# Patient Record
Sex: Female | Born: 1990 | Race: White | Hispanic: No | Marital: Married | Smoking: Former smoker
Health system: Southern US, Community
[De-identification: ages and names within clinical notes are randomized; demographics above are authoritative.]

## PROBLEM LIST (undated history)

## (undated) ENCOUNTER — Inpatient Hospital Stay (HOSPITAL_COMMUNITY): Payer: Self-pay

## (undated) DIAGNOSIS — O24419 Gestational diabetes mellitus in pregnancy, unspecified control: Secondary | ICD-10-CM

## (undated) DIAGNOSIS — R87629 Unspecified abnormal cytological findings in specimens from vagina: Secondary | ICD-10-CM

## (undated) DIAGNOSIS — R87619 Unspecified abnormal cytological findings in specimens from cervix uteri: Secondary | ICD-10-CM

## (undated) HISTORY — DX: Unspecified abnormal cytological findings in specimens from vagina: R87.629

## (undated) HISTORY — DX: Unspecified abnormal cytological findings in specimens from cervix uteri: R87.619

## (undated) HISTORY — PX: WISDOM TOOTH EXTRACTION: SHX21

---

## 2015-01-14 ENCOUNTER — Encounter: Payer: Self-pay | Admitting: Advanced Practice Midwife

## 2015-01-14 ENCOUNTER — Ambulatory Visit (INDEPENDENT_AMBULATORY_CARE_PROVIDER_SITE_OTHER): Payer: 59 | Admitting: Advanced Practice Midwife

## 2015-01-14 VITALS — BP 120/65 | HR 103 | Ht 62.0 in | Wt 155.0 lb

## 2015-01-14 DIAGNOSIS — Z124 Encounter for screening for malignant neoplasm of cervix: Secondary | ICD-10-CM

## 2015-01-14 DIAGNOSIS — Z113 Encounter for screening for infections with a predominantly sexual mode of transmission: Secondary | ICD-10-CM | POA: Diagnosis not present

## 2015-01-14 DIAGNOSIS — Z3481 Encounter for supervision of other normal pregnancy, first trimester: Secondary | ICD-10-CM | POA: Diagnosis not present

## 2015-01-14 DIAGNOSIS — Z3491 Encounter for supervision of normal pregnancy, unspecified, first trimester: Secondary | ICD-10-CM

## 2015-01-14 NOTE — Progress Notes (Signed)
   Subjective:    Savannah Wade is a G2P0010 7466w2d by US today being seen today for her first obstetrical visit.  Her obstetrical history is significant for Primiparity . Patient does intend to breast feed. Pregnancy history fully reviewed.  Patient reports no complaints.  Filed Vitals:   01/14/15 1037 01/14/15 1040  BP: 120/65   Pulse: 103   Height:  5\' 2"  (1.575 m)  Weight: 155 lb (70.308 kg)     HISTORY: OB History  Gravida Para Term Preterm AB SAB TAB Ectopic Multiple Living  2 0   1 1    0    # Outcome Date GA Lbr Len/2nd Weight Sex Delivery Anes PTL Lv  2 Current           1 SAB              Past Medical History  Diagnosis Date  . Vaginal Pap smear, abnormal    Past Surgical History  Procedure Laterality Date  . Wisdom tooth extraction     Family History  Problem Relation Age of Onset  . Cancer Father     testicular  . Diabetes Paternal Grandmother   . Heart attack Paternal Grandmother     bypass surgery  . Cancer - Colon Paternal Grandfather     colon  . Cancer Paternal Grandfather     prostate  . COPD Paternal Grandfather   . Heart attack Maternal Grandfather 50  . Hyperlipidemia Maternal Grandfather   . Hypertension Paternal Grandmother   . Heart attack Maternal Grandmother     x 2  . Coronary artery disease Maternal Grandmother      Exam    Uterus:   Slightly enlarged  Pelvic Exam:    Perineum: No Hemorrhoids, Normal Perineum   Vulva: normal   Vagina:  normal mucosa, normal discharge   pH: NA   Cervix: no bleeding following Pap, no cervical motion tenderness and nulliparous appearance   Adnexa: no mass, fullness, tenderness   Bony Pelvis: average and unproven  System: Breast:  Declined   Skin: normal coloration and turgor, no rashes    Neurologic: oriented, normal mood, grossly non-focal   Extremities: normal strength, tone, and muscle mass   HEENT sclera clear, anicteric   Mouth/Teeth mucous membranes moist, pharynx normal without  lesions and dental hygiene good   Neck supple and no masses   Cardiovascular: regular rate and rhythm, no murmurs or gallops   Respiratory:  appears well, vitals normal, no respiratory distress, acyanotic, normal RR, chest clear, no wheezing, crepitations, rhonchi, normal symmetric air entry   Abdomen: soft, non-tender; bowel sounds normal; no masses,  no organomegaly   Urinary: urethral meatus normal      Assessment:    Pregnancy: G2P0010 Patient Active Problem List   Diagnosis Date Noted  . Normal pregnancy in first trimester 01/14/2015     1. Normal pregnancy in first trimester  - Prenatal (OB Panel) - HIV antibody (with reflex) - CULTURE, URINE COMPREHENSIVE - Cystic fibrosis diagnostic study - Cytology - PAP     Plan:     Initial labs drawn. Prenatal vitamins. Problem list reviewed and updated. Genetic Screening discussed First Screen: declined.  Ultrasound discussed; fetal survey: requested.  Follow up in 4 weeks.  Dorathy KinsmanSMITH, Jaymon Dudek 01/14/2015

## 2015-01-14 NOTE — Progress Notes (Signed)
Bedside U/S shows IUP with FHT of 156 and CRL 12.458mm  GA is 7w 2 days.

## 2015-01-14 NOTE — Patient Instructions (Signed)
First Trimester of Pregnancy The first trimester of pregnancy is from week 1 until the end of week 12 (months 1 through 3). A week after a sperm fertilizes an egg, the egg will implant on the wall of the uterus. This embryo will begin to develop into a baby. Genes from you and your partner are forming the baby. The female genes determine whether the baby is a boy or a girl. At 6-8 weeks, the eyes and face are formed, and the heartbeat can be seen on ultrasound. At the end of 12 weeks, all the baby's organs are formed.  Now that you are pregnant, you will want to do everything you can to have a healthy baby. Two of the most important things are to get good prenatal care and to follow your health care provider's instructions. Prenatal care is all the medical care you receive before the baby's birth. This care will help prevent, find, and treat any problems during the pregnancy and childbirth. BODY CHANGES Your body goes through many changes during pregnancy. The changes vary from woman to woman.   You may gain or lose a couple of pounds at first.  You may feel sick to your stomach (nauseous) and throw up (vomit). If the vomiting is uncontrollable, call your health care provider.  You may tire easily.  You may develop headaches that can be relieved by medicines approved by your health care provider.  You may urinate more often. Painful urination may mean you have a bladder infection.  You may develop heartburn as a result of your pregnancy.  You may develop constipation because certain hormones are causing the muscles that push waste through your intestines to slow down.  You may develop hemorrhoids or swollen, bulging veins (varicose veins).  Your breasts may begin to grow larger and become tender. Your nipples may stick out more, and the tissue that surrounds them (areola) may become darker.  Your gums may bleed and may be sensitive to brushing and flossing.  Dark spots or blotches (chloasma,  mask of pregnancy) may develop on your face. This will likely fade after the baby is born.  Your menstrual periods will stop.  You may have a loss of appetite.  You may develop cravings for certain kinds of food.  You may have changes in your emotions from day to day, such as being excited to be pregnant or being concerned that something may go wrong with the pregnancy and baby.  You may have more vivid and strange dreams.  You may have changes in your hair. These can include thickening of your hair, rapid growth, and changes in texture. Some women also have hair loss during or after pregnancy, or hair that feels dry or thin. Your hair will most likely return to normal after your baby is born. WHAT TO EXPECT AT YOUR PRENATAL VISITS During a routine prenatal visit:  You will be weighed to make sure you and the baby are growing normally.  Your blood pressure will be taken.  Your abdomen will be measured to track your baby's growth.  The fetal heartbeat will be listened to starting around week 10 or 12 of your pregnancy.  Test results from any previous visits will be discussed. Your health care provider may ask you:  How you are feeling.  If you are feeling the baby move.  If you have had any abnormal symptoms, such as leaking fluid, bleeding, severe headaches, or abdominal cramping.  If you are using any tobacco products,   including cigarettes, chewing tobacco, and electronic cigarettes.  If you have any questions. Other tests that may be performed during your first trimester include:  Blood tests to find your blood type and to check for the presence of any previous infections. They will also be used to check for low iron levels (anemia) and Rh antibodies. Later in the pregnancy, blood tests for diabetes will be done along with other tests if problems develop.  Urine tests to check for infections, diabetes, or protein in the urine.  An ultrasound to confirm the proper growth  and development of the baby.  An amniocentesis to check for possible genetic problems.  Fetal screens for spina bifida and Down syndrome.  You may need other tests to make sure you and the baby are doing well.  HIV (human immunodeficiency virus) testing. Routine prenatal testing includes screening for HIV, unless you choose not to have this test. HOME CARE INSTRUCTIONS  Medicines  Follow your health care provider's instructions regarding medicine use. Specific medicines may be either safe or unsafe to take during pregnancy.  Take your prenatal vitamins as directed.  If you develop constipation, try taking a stool softener if your health care provider approves. Diet  Eat regular, well-balanced meals. Choose a variety of foods, such as meat or vegetable-based protein, fish, milk and low-fat dairy products, vegetables, fruits, and whole grain breads and cereals. Your health care provider will help you determine the amount of weight gain that is right for you.  Avoid raw meat and uncooked cheese. These carry germs that can cause birth defects in the baby.  Eating four or five small meals rather than three large meals a day may help relieve nausea and vomiting. If you start to feel nauseous, eating a few soda crackers can be helpful. Drinking liquids between meals instead of during meals also seems to help nausea and vomiting.  If you develop constipation, eat more high-fiber foods, such as fresh vegetables or fruit and whole grains. Drink enough fluids to keep your urine clear or pale yellow. Activity and Exercise  Exercise only as directed by your health care provider. Exercising will help you:  Control your weight.  Stay in shape.  Be prepared for labor and delivery.  Experiencing pain or cramping in the lower abdomen or low back is a good sign that you should stop exercising. Check with your health care provider before continuing normal exercises.  Try to avoid standing for long  periods of time. Move your legs often if you must stand in one place for a long time.  Avoid heavy lifting.  Wear low-heeled shoes, and practice good posture.  You may continue to have sex unless your health care provider directs you otherwise. Relief of Pain or Discomfort  Wear a good support bra for breast tenderness.   Take warm sitz baths to soothe any pain or discomfort caused by hemorrhoids. Use hemorrhoid cream if your health care provider approves.   Rest with your legs elevated if you have leg cramps or low back pain.  If you develop varicose veins in your legs, wear support hose. Elevate your feet for 15 minutes, 3-4 times a day. Limit salt in your diet. Prenatal Care  Schedule your prenatal visits by the twelfth week of pregnancy. They are usually scheduled monthly at first, then more often in the last 2 months before delivery.  Write down your questions. Take them to your prenatal visits.  Keep all your prenatal visits as directed by your   health care provider. Safety  Wear your seat belt at all times when driving.  Make a list of emergency phone numbers, including numbers for family, friends, the hospital, and police and fire departments. General Tips  Ask your health care provider for a referral to a local prenatal education class. Begin classes no later than at the beginning of month 6 of your pregnancy.  Ask for help if you have counseling or nutritional needs during pregnancy. Your health care provider can offer advice or refer you to specialists for help with various needs.  Do not use hot tubs, steam rooms, or saunas.  Do not douche or use tampons or scented sanitary pads.  Do not cross your legs for long periods of time.  Avoid cat litter boxes and soil used by cats. These carry germs that can cause birth defects in the baby and possibly loss of the fetus by miscarriage or stillbirth.  Avoid all smoking, herbs, alcohol, and medicines not prescribed by  your health care provider. Chemicals in these affect the formation and growth of the baby.  Do not use any tobacco products, including cigarettes, chewing tobacco, and electronic cigarettes. If you need help quitting, ask your health care provider. You may receive counseling support and other resources to help you quit.  Schedule a dentist appointment. At home, brush your teeth with a soft toothbrush and be gentle when you floss. SEEK MEDICAL CARE IF:   You have dizziness.  You have mild pelvic cramps, pelvic pressure, or nagging pain in the abdominal area.  You have persistent nausea, vomiting, or diarrhea.  You have a bad smelling vaginal discharge.  You have pain with urination.  You notice increased swelling in your face, hands, legs, or ankles. SEEK IMMEDIATE MEDICAL CARE IF:   You have a fever.  You are leaking fluid from your vagina.  You have spotting or bleeding from your vagina.  You have severe abdominal cramping or pain.  You have rapid weight gain or loss.  You vomit blood or material that looks like coffee grounds.  You are exposed to German measles and have never had them.  You are exposed to fifth disease or chickenpox.  You develop a severe headache.  You have shortness of breath.  You have any kind of trauma, such as from a fall or a car accident.   This information is not intended to replace advice given to you by your health care provider. Make sure you discuss any questions you have with your health care provider.   Document Released: 01/27/2001 Document Revised: 02/23/2014 Document Reviewed: 12/13/2012 Elsevier Interactive Patient Education 2016 [employer]  

## 2015-01-15 LAB — OBSTETRIC PANEL
ANTIBODY SCREEN: NEGATIVE
BASOS ABS: 0 10*3/uL (ref 0.0–0.1)
Basophils Relative: 0 % (ref 0–1)
EOS PCT: 2 % (ref 0–5)
Eosinophils Absolute: 0.2 10*3/uL (ref 0.0–0.7)
HCT: 40.4 % (ref 36.0–46.0)
HEMOGLOBIN: 13.8 g/dL (ref 12.0–15.0)
Hepatitis B Surface Ag: NEGATIVE
LYMPHS ABS: 1.6 10*3/uL (ref 0.7–4.0)
LYMPHS PCT: 21 % (ref 12–46)
MCH: 30.9 pg (ref 26.0–34.0)
MCHC: 34.2 g/dL (ref 30.0–36.0)
MCV: 90.6 fL (ref 78.0–100.0)
MPV: 10.2 fL (ref 8.6–12.4)
Monocytes Absolute: 0.5 10*3/uL (ref 0.1–1.0)
Monocytes Relative: 6 % (ref 3–12)
NEUTROS ABS: 5.4 10*3/uL (ref 1.7–7.7)
NEUTROS PCT: 71 % (ref 43–77)
PLATELETS: 242 10*3/uL (ref 150–400)
RBC: 4.46 MIL/uL (ref 3.87–5.11)
RDW: 13.9 % (ref 11.5–15.5)
RUBELLA: 1.02 {index} — AB (ref <0.90)
Rh Type: POSITIVE
WBC: 7.6 10*3/uL (ref 4.0–10.5)

## 2015-01-15 LAB — HIV ANTIBODY (ROUTINE TESTING W REFLEX): HIV: NONREACTIVE

## 2015-01-15 LAB — CYSTIC FIBROSIS DIAGNOSTIC STUDY

## 2015-01-16 LAB — CULTURE, URINE COMPREHENSIVE
Colony Count: NO GROWTH
Organism ID, Bacteria: NO GROWTH

## 2015-01-17 LAB — CYTOLOGY - PAP

## 2015-02-13 ENCOUNTER — Ambulatory Visit (INDEPENDENT_AMBULATORY_CARE_PROVIDER_SITE_OTHER): Payer: 59 | Admitting: Obstetrics & Gynecology

## 2015-02-13 ENCOUNTER — Encounter: Payer: Self-pay | Admitting: Obstetrics & Gynecology

## 2015-02-13 VITALS — BP 105/59 | HR 91 | Wt 157.0 lb

## 2015-02-13 DIAGNOSIS — Z3481 Encounter for supervision of other normal pregnancy, first trimester: Secondary | ICD-10-CM

## 2015-02-13 DIAGNOSIS — Z3491 Encounter for supervision of normal pregnancy, unspecified, first trimester: Secondary | ICD-10-CM

## 2015-02-13 DIAGNOSIS — F988 Other specified behavioral and emotional disorders with onset usually occurring in childhood and adolescence: Secondary | ICD-10-CM | POA: Insufficient documentation

## 2015-02-13 DIAGNOSIS — L409 Psoriasis, unspecified: Secondary | ICD-10-CM | POA: Insufficient documentation

## 2015-02-13 NOTE — Progress Notes (Signed)
Subjective:  Lanny HurstMegan Dearmas is a 24 y.o. G2P0010 at 5422w4d being seen today for ongoing prenatal care.  She is currently monitored for the following issues for this low-risk pregnancy and has Normal pregnancy in first trimester; Psoriasis; and ADD (attention deficit disorder) on her problem list.  Patient reports no complaints.   . Vag. Bleeding: None.   . Denies leaking of fluid.   The following portions of the patient's history were reviewed and updated as appropriate: allergies, current medications, past family history, past medical history, past social history, past surgical history and problem list. Problem list updated.  Objective:   Filed Vitals:   02/13/15 1027  BP: 105/59  Pulse: 91  Weight: 157 lb (71.215 kg)    Fetal Status: Fetal Heart Rate (bpm): 164         General:  Alert, oriented and cooperative. Patient is in no acute distress.  Skin: Skin is warm and dry. No rash noted.   Cardiovascular: Normal heart rate noted  Respiratory: Normal respiratory effort, no problems with respiration noted  Abdomen: Soft, gravid, appropriate for gestational age.       Pelvic: Vag. Bleeding: None Vag D/C Character: Thin   Cervical exam deferred        Extremities: Normal range of motion.  Edema: None  Mental Status: Normal mood and affect. Normal behavior. Normal judgment and thought content.   Urinalysis: Urine Protein: Negative Urine Glucose: Negative  Assessment and Plan:  Pregnancy: G2P0010 at 6722w4d  1. Normal pregnancy in first trimester -genetics declined -declined baby scripts -anatomy US at 19.5 weeks  Preterm labor symptoms and general obstetric precautions including but not limited to vaginal bleeding, contractions, leaking of fluid and fetal movement were reviewed in detail with the patient. Please refer to After Visit Summary for other counseling recommendations.  No Follow-up on file.   Lesly DukesKelly H Alexes Menchaca, MD

## 2015-02-25 ENCOUNTER — Telehealth: Payer: Self-pay | Admitting: *Deleted

## 2015-02-25 NOTE — Telephone Encounter (Signed)
Increase in headaches since pregnancy.  She uses tylenol and ice packs and it seems to go away.  She just wanting us to be aware of the headaches.  i explained that we do have a headache specialist that comes in once a month if we need to schedule her an appt.

## 2015-03-13 ENCOUNTER — Ambulatory Visit (INDEPENDENT_AMBULATORY_CARE_PROVIDER_SITE_OTHER): Payer: BLUE CROSS/BLUE SHIELD | Admitting: Obstetrics & Gynecology

## 2015-03-13 ENCOUNTER — Encounter: Payer: Self-pay | Admitting: Obstetrics & Gynecology

## 2015-03-13 VITALS — BP 103/63 | HR 84 | Wt 158.0 lb

## 2015-03-13 DIAGNOSIS — Z3482 Encounter for supervision of other normal pregnancy, second trimester: Secondary | ICD-10-CM

## 2015-03-13 DIAGNOSIS — Z3491 Encounter for supervision of normal pregnancy, unspecified, first trimester: Secondary | ICD-10-CM

## 2015-03-13 NOTE — Progress Notes (Signed)
Subjective:  Savannah Wade is a 25 y.o. MW G2P0010 at [redacted]w[redacted]d being seen today for ongoing prenatal care.  She is currently monitored for the following issues for this low-risk pregnancy and has Normal pregnancy in first trimester; Psoriasis; and ADD (attention deficit disorder) on her problem list.  Patient reports no complaints.  Contractions: Not present. Vag. Bleeding: None.  Movement: Absent. Denies leaking of fluid.   The following portions of the patient's history were reviewed and updated as appropriate: allergies, current medications, past family history, past medical history, past social history, past surgical history and problem list. Problem list updated.  Objective:   Filed Vitals:   03/13/15 0913  BP: 103/63  Pulse: 84  Weight: 158 lb (71.668 kg)    Fetal Status: Fetal Heart Rate (bpm): 149   Movement: Absent     General:  Alert, oriented and cooperative. Patient is in no acute distress.  Skin: Skin is warm and dry. No rash noted.   Cardiovascular: Normal heart rate noted  Respiratory: Normal respiratory effort, no problems with respiration noted  Abdomen: Soft, gravid, appropriate for gestational age. Pain/Pressure: Absent     Pelvic: Vag. Bleeding: None Vag D/C Character: Thin   Cervical exam deferred        Extremities: Normal range of motion.  Edema: None  Mental Status: Normal mood and affect. Normal behavior. Normal judgment and thought content.   Urinalysis: Urine Protein: Negative Urine Glucose: Negative  Assessment and Plan:  Pregnancy: G2P0010 at [redacted]w[redacted]d  1. Normal pregnancy in first trimester  - Flu Vaccine QUAD 36+ mos IM (Fluarix, Quad PF) - [place] MFM OB COMP + 14 WK; Future  Preterm labor symptoms and general obstetric precautions including but not limited to vaginal bleeding, contractions, leaking of fluid and fetal movement were reviewed in detail with the patient. Please refer to After Visit Summary for other counseling recommendations.  Return for u/s  should be at [redacted] weeks EGA. Thanks.   Allie Bossier, MD

## 2015-04-03 ENCOUNTER — Ambulatory Visit (HOSPITAL_COMMUNITY)
Admission: RE | Admit: 2015-04-03 | Discharge: 2015-04-03 | Disposition: A | Discharge status: Home / Self Care | Payer: BLUE CROSS/BLUE SHIELD | Source: Ambulatory Visit | Attending: Obstetrics & Gynecology | Admitting: Obstetrics & Gynecology

## 2015-04-03 DIAGNOSIS — Z3A18 18 weeks gestation of pregnancy: Secondary | ICD-10-CM | POA: Diagnosis not present

## 2015-04-03 DIAGNOSIS — Z3491 Encounter for supervision of normal pregnancy, unspecified, first trimester: Secondary | ICD-10-CM

## 2015-04-03 DIAGNOSIS — Z36 Encounter for antenatal screening of mother: Secondary | ICD-10-CM | POA: Diagnosis present

## 2015-04-10 ENCOUNTER — Ambulatory Visit (INDEPENDENT_AMBULATORY_CARE_PROVIDER_SITE_OTHER): Payer: BLUE CROSS/BLUE SHIELD | Admitting: Obstetrics & Gynecology

## 2015-04-10 VITALS — BP 100/62 | HR 94 | Wt 160.0 lb

## 2015-04-10 DIAGNOSIS — Z3491 Encounter for supervision of normal pregnancy, unspecified, first trimester: Secondary | ICD-10-CM

## 2015-04-10 DIAGNOSIS — Z3482 Encounter for supervision of other normal pregnancy, second trimester: Secondary | ICD-10-CM

## 2015-04-10 NOTE — Progress Notes (Signed)
Subjective:  Savannah Wade is a 25 y.o. G2P0010 at [redacted]w[redacted]d being seen today for ongoing prenatal care.  She is currently monitored for the following issues for this low-risk pregnancy and has Normal pregnancy in first trimester; Psoriasis; and ADD (attention deficit disorder) on her problem list.  Patient reports no complaints.  Contractions: Not present. Vag. Bleeding: None.  Movement: Present. Denies leaking of fluid.   The following portions of the patient's history were reviewed and updated as appropriate: allergies, current medications, past family history, past medical history, past social history, past surgical history and problem list. Problem list updated.  Objective:   Filed Vitals:   04/10/15 0925  BP: 100/62  Pulse: 94  Weight: 160 lb (72.576 kg)    Fetal Status: Fetal Heart Rate (bpm): 152   Movement: Present     General:  Alert, oriented and cooperative. Patient is in no acute distress.  Skin: Skin is warm and dry. No rash noted.   Cardiovascular: Normal heart rate noted  Respiratory: Normal respiratory effort, no problems with respiration noted  Abdomen: Soft, gravid, appropriate for gestational age. Pain/Pressure: Absent     Pelvic: Vag. Bleeding: None Vag D/C Character: Thin   Cervical exam deferred        Extremities: Normal range of motion.  Edema: None  Mental Status: Normal mood and affect. Normal behavior. Normal judgment and thought content.   Urinalysis: Urine Protein: Negative Urine Glucose: Negative  Assessment and Plan:  Pregnancy: G2P0010 at [redacted]w[redacted]d  1. Normal pregnancy in first trimester  - [place] MFM OB FOLLOW UP; Future  Preterm labor symptoms and general obstetric precautions including but not limited to vaginal bleeding, contractions, leaking of fluid and fetal movement were reviewed in detail with the patient. Please refer to After Visit Summary for other counseling recommendations.  Return in about 4 weeks (around 05/08/2015).   Allie Bossier, MD

## 2015-05-08 ENCOUNTER — Ambulatory Visit (INDEPENDENT_AMBULATORY_CARE_PROVIDER_SITE_OTHER): Payer: BLUE CROSS/BLUE SHIELD | Admitting: Obstetrics & Gynecology

## 2015-05-08 ENCOUNTER — Encounter: Payer: Self-pay | Admitting: Obstetrics & Gynecology

## 2015-05-08 VITALS — BP 100/65 | HR 82 | Wt 163.0 lb

## 2015-05-08 DIAGNOSIS — Z3491 Encounter for supervision of normal pregnancy, unspecified, first trimester: Secondary | ICD-10-CM

## 2015-05-08 DIAGNOSIS — Z3482 Encounter for supervision of other normal pregnancy, second trimester: Secondary | ICD-10-CM

## 2015-05-08 NOTE — Progress Notes (Signed)
Subjective:  Savannah HurstMegan Wade is a 25 y.o. G2P0010 at 2134w4d being seen today for ongoing prenatal care.  She is currently monitored for the following issues for this low-risk pregnancy and has Normal pregnancy in first trimester; Psoriasis; and ADD (attention deficit disorder) on her problem list.  Patient reports no complaints.  Contractions: Not present. Vag. Bleeding: None.  Movement: Present. Denies leaking of fluid.   The following portions of the patient's history were reviewed and updated as appropriate: allergies, current medications, past family history, past medical history, past social history, past surgical history and problem list. Problem list updated.  Objective:   Filed Vitals:   05/08/15 0902  BP: 100/65  Pulse: 82  Weight: 163 lb (73.936 kg)    Fetal Status:     Movement: Present     General:  Alert, oriented and cooperative. Patient is in no acute distress.  Skin: Skin is warm and dry. No rash noted.   Cardiovascular: Normal heart rate noted  Respiratory: Normal respiratory effort, no problems with respiration noted  Abdomen: Soft, gravid, appropriate for gestational age. Pain/Pressure: Absent     Pelvic: Vag. Bleeding: None Vag D/C Character: Thin   Cervical exam deferred        Extremities: Normal range of motion.  Edema: None  Mental Status: Normal mood and affect. Normal behavior. Normal judgment and thought content.   Urinalysis: Urine Protein: Negative Urine Glucose: Negative  Assessment and Plan:  Pregnancy: G2P0010 at 5334w4d  There are no diagnoses linked to this encounter. Preterm labor symptoms and general obstetric precautions including but not limited to vaginal bleeding, contractions, leaking of fluid and fetal movement were reviewed in detail with the patient. Please refer to After Visit Summary for other counseling recommendations.   Saline spary and humidifier for nasal irritation US In April for F/U anatomy Pt to pick pediatrician. Weight gain  reviewed.    Lesly DukesKelly H Kadeidra Coryell, MD

## 2015-05-22 ENCOUNTER — Ambulatory Visit (HOSPITAL_COMMUNITY)
Admission: RE | Admit: 2015-05-22 | Discharge: 2015-05-22 | Disposition: A | Discharge status: Home / Self Care | Payer: BLUE CROSS/BLUE SHIELD | Source: Ambulatory Visit | Attending: Obstetrics & Gynecology | Admitting: Obstetrics & Gynecology

## 2015-05-22 DIAGNOSIS — Z36 Encounter for antenatal screening of mother: Secondary | ICD-10-CM | POA: Insufficient documentation

## 2015-05-22 DIAGNOSIS — Z3491 Encounter for supervision of normal pregnancy, unspecified, first trimester: Secondary | ICD-10-CM

## 2015-05-22 DIAGNOSIS — Z3A25 25 weeks gestation of pregnancy: Secondary | ICD-10-CM | POA: Insufficient documentation

## 2015-06-05 ENCOUNTER — Ambulatory Visit (INDEPENDENT_AMBULATORY_CARE_PROVIDER_SITE_OTHER): Payer: BLUE CROSS/BLUE SHIELD | Admitting: Obstetrics & Gynecology

## 2015-06-05 VITALS — BP 105/59 | HR 88 | Wt 164.0 lb

## 2015-06-05 DIAGNOSIS — Z23 Encounter for immunization: Secondary | ICD-10-CM

## 2015-06-05 DIAGNOSIS — F988 Other specified behavioral and emotional disorders with onset usually occurring in childhood and adolescence: Secondary | ICD-10-CM

## 2015-06-05 DIAGNOSIS — Z3482 Encounter for supervision of other normal pregnancy, second trimester: Secondary | ICD-10-CM

## 2015-06-05 DIAGNOSIS — Z3491 Encounter for supervision of normal pregnancy, unspecified, first trimester: Secondary | ICD-10-CM

## 2015-06-05 DIAGNOSIS — F909 Attention-deficit hyperactivity disorder, unspecified type: Secondary | ICD-10-CM

## 2015-06-05 DIAGNOSIS — Z3492 Encounter for supervision of normal pregnancy, unspecified, second trimester: Secondary | ICD-10-CM

## 2015-06-05 DIAGNOSIS — Z36 Encounter for antenatal screening of mother: Secondary | ICD-10-CM | POA: Diagnosis not present

## 2015-06-05 LAB — CBC
HEMATOCRIT: 34.8 % — AB (ref 35.0–45.0)
HEMOGLOBIN: 11.5 g/dL — AB (ref 11.7–15.5)
MCH: 29.9 pg (ref 27.0–33.0)
MCHC: 33 g/dL (ref 32.0–36.0)
MCV: 90.6 fL (ref 80.0–100.0)
MPV: 10.3 fL (ref 7.5–12.5)
Platelets: 235 10*3/uL (ref 140–400)
RBC: 3.84 MIL/uL (ref 3.80–5.10)
RDW: 13.7 % (ref 11.0–15.0)
WBC: 9.4 10*3/uL (ref 3.8–10.8)

## 2015-06-05 NOTE — Progress Notes (Signed)
Subjective:  Savannah Wade is a 25 y.o. G2P0010 at 120w4d being seen today for ongoing prenatal care.  She is currently monitored for the following issues for this low-risk pregnancy and has Normal pregnancy in first trimester; Psoriasis; and ADD (attention deficit disorder) on her problem list.  Patient reports no complaints except for some SOB on occasion when she it sitting, never when she is working or walking. Several deep breaths will relieve this SOB.  Contractions: Not present. Vag. Bleeding: None.  Movement: Present. Denies leaking of fluid.   The following portions of the patient's history were reviewed and updated as appropriate: allergies, current medications, past family history, past medical history, past social history, past surgical history and problem list. Problem list updated.  Objective:   Filed Vitals:   06/05/15 0903  BP: 105/59  Pulse: 88  Weight: 164 lb (74.39 kg)    Fetal Status:     Movement: Present     General:  Alert, oriented and cooperative. Patient is in no acute distress.  Skin: Skin is warm and dry. No rash noted.   Cardiovascular: Normal heart rate noted  Respiratory: Normal respiratory effort, no problems with respiration noted  Abdomen: Soft, gravid, appropriate for gestational age. Pain/Pressure: Absent     Pelvic: Vag. Bleeding: None Vag D/C Character: Thin   Cervical exam deferred        Extremities: Normal range of motion.  Edema: None  Mental Status: Normal mood and affect. Normal behavior. Normal judgment and thought content.  Lungs: CTAB Urinalysis:      Assessment and Plan:  Pregnancy: G2P0010 at 7720w4d  1. Prenatal care in second trimester  - CBC - HIV antibody (with reflex) - RPR - Glucose Tolerance, 1 HR (50g)  2. Normal pregnancy in first trimester   3. ADD (attention deficit disorder)   Preterm labor symptoms and general obstetric precautions including but not limited to vaginal bleeding, contractions, leaking of fluid and  fetal movement were reviewed in detail with the patient. Please refer to After Visit Summary for other counseling recommendations.  Return in about 3 weeks (around 06/26/2015).   Allie BossierMyra C Sheralee Qazi, MD

## 2015-06-06 ENCOUNTER — Telehealth: Payer: Self-pay | Admitting: *Deleted

## 2015-06-06 LAB — HIV ANTIBODY (ROUTINE TESTING W REFLEX): HIV: NONREACTIVE

## 2015-06-06 LAB — GLUCOSE TOLERANCE, 1 HOUR (50G) W/O FASTING: Glucose, 1 Hr, gestational: 143 mg/dL — ABNORMAL HIGH (ref <140)

## 2015-06-06 LAB — RPR

## 2015-06-06 NOTE — Telephone Encounter (Signed)
Pt notified of abnormal 1 hr GTT of 143.  She is scheduled for a fasting 3 hr GTT.

## 2015-06-10 ENCOUNTER — Ambulatory Visit: Payer: BLUE CROSS/BLUE SHIELD | Admitting: *Deleted

## 2015-06-10 VITALS — BP 103/64 | HR 86 | Resp 16 | Wt 168.0 lb

## 2015-06-10 DIAGNOSIS — Z3492 Encounter for supervision of normal pregnancy, unspecified, second trimester: Secondary | ICD-10-CM

## 2015-06-11 NOTE — Progress Notes (Signed)
Pt presented to office today with c/o's decreased fetal movement.  Pt states that  Baby is usually most active at night but had only felt 2 kicks in last 2 hours.  She states that she is just anxious.  Bedside U/S showed active fetus and fetal breathing was noted.  FHT 152 BPM via U/S doppler.  Pt reassured and explained that babies sleep in utero also.  Encouraged patient to always feel free to call if she is anxoius or having concerns with pregnancy

## 2015-06-12 ENCOUNTER — Other Ambulatory Visit: Payer: BLUE CROSS/BLUE SHIELD

## 2015-06-12 DIAGNOSIS — R7309 Other abnormal glucose: Secondary | ICD-10-CM

## 2015-06-13 ENCOUNTER — Telehealth: Payer: Self-pay | Admitting: *Deleted

## 2015-06-13 ENCOUNTER — Encounter: Payer: Self-pay | Admitting: *Deleted

## 2015-06-13 LAB — GLUCOSE TOLERANCE, 3 HOURS
GLUCOSE 3 HOUR GTT: 145 mg/dL — AB (ref <145)
GLUCOSE, 2 HOUR-GESTATIONAL: 142 mg/dL (ref <165)
GLUCOSE, FASTING-GESTATIONAL: 69 mg/dL (ref 65–104)
Glucose Tolerance, 1 hour: 160 mg/dL (ref <190)

## 2015-06-13 NOTE — Telephone Encounter (Signed)
Pt notified of normal 3 hr GTT. And FMLA is ready for P/U

## 2015-06-13 NOTE — Telephone Encounter (Signed)
Pt notified of normal 3 hr GTT. 

## 2015-06-19 ENCOUNTER — Ambulatory Visit (INDEPENDENT_AMBULATORY_CARE_PROVIDER_SITE_OTHER): Payer: BLUE CROSS/BLUE SHIELD | Admitting: Obstetrics & Gynecology

## 2015-06-19 VITALS — BP 102/59 | HR 80 | Wt 170.0 lb

## 2015-06-19 DIAGNOSIS — Z3483 Encounter for supervision of other normal pregnancy, third trimester: Secondary | ICD-10-CM

## 2015-06-19 DIAGNOSIS — Z3491 Encounter for supervision of normal pregnancy, unspecified, first trimester: Secondary | ICD-10-CM

## 2015-06-19 NOTE — Progress Notes (Signed)
Subjective:  Savannah HurstMegan Schwinn is a 25 y.o. G2P0010 at 6170w4d being seen today for ongoing prenatal care.  She is currently monitored for the following issues for this low-risk pregnancy and has Normal pregnancy in first trimester; Psoriasis; and ADD (attention deficit disorder) on her problem list.  Patient reports no complaints.  Contractions: Not present. Vag. Bleeding: None.  Movement: Present. Denies leaking of fluid.   The following portions of the patient's history were reviewed and updated as appropriate: allergies, current medications, past family history, past medical history, past social history, past surgical history and problem list. Problem list updated.  Objective:   Filed Vitals:   06/19/15 0858  BP: 102/59  Pulse: 80  Weight: 170 lb (77.111 kg)    Fetal Status: Fetal Heart Rate (bpm): 150 Fundal Height: 31 cm Movement: Present     General:  Alert, oriented and cooperative. Patient is in no acute distress.  Skin: Skin is warm and dry. No rash noted.   Cardiovascular: Normal heart rate noted  Respiratory: Normal respiratory effort, no problems with respiration noted  Abdomen: Soft, gravid, appropriate for gestational age. Pain/Pressure: Absent     Pelvic: Vag. Bleeding: None Vag D/C Character: Thin   Cervical exam deferred        Extremities: Normal range of motion.  Edema: None  Mental Status: Normal mood and affect. Normal behavior. Normal judgment and thought content.   Urinalysis: Urine Protein: Negative Urine Glucose: Negative  Assessment and Plan:  Pregnancy: G2P0010 at 6670w4d  1. Normal pregnancy in first trimester Anatomy nml and complete.  No complaints  Preterm labor symptoms and general obstetric precautions including but not limited to vaginal bleeding, contractions, leaking of fluid and fetal movement were reviewed in detail with the patient. Please refer to After Visit Summary for other counseling recommendations.  Return in about 2 weeks (around  07/03/2015).   Savannah DukesKelly H Deitra Craine, MD

## 2015-07-08 ENCOUNTER — Ambulatory Visit (INDEPENDENT_AMBULATORY_CARE_PROVIDER_SITE_OTHER): Payer: BLUE CROSS/BLUE SHIELD | Admitting: Advanced Practice Midwife

## 2015-07-08 VITALS — BP 109/61 | HR 91 | Wt 174.0 lb

## 2015-07-08 DIAGNOSIS — Z3483 Encounter for supervision of other normal pregnancy, third trimester: Secondary | ICD-10-CM

## 2015-07-08 DIAGNOSIS — Z3491 Encounter for supervision of normal pregnancy, unspecified, first trimester: Secondary | ICD-10-CM

## 2015-07-08 NOTE — Progress Notes (Signed)
Had episode of lightheadedness and sweating yesterday.

## 2015-07-08 NOTE — Patient Instructions (Signed)
Fetal Movement Counts  Patient Name: __________________________________________________ Patient Due Date: ____________________  Performing a fetal movement count is highly recommended in high-risk pregnancies, but it is good for every pregnant woman to do. Your health care provider may ask you to start counting fetal movements at 28 weeks of the pregnancy. Fetal movements often increase:  · After eating a full meal.  · After physical activity.  · After eating or drinking something sweet or cold.  · At rest.  Pay attention to when you feel the baby is most active. This will help you notice a pattern of your baby's sleep and wake cycles and what factors contribute to an increase in fetal movement. It is important to perform a fetal movement count at the same time each day when your baby is normally most active.   HOW TO COUNT FETAL MOVEMENTS  1. Find a quiet and comfortable area to sit or lie down on your left side. Lying on your left side provides the best blood and oxygen circulation to your baby.  2. Write down the day and time on a sheet of paper or in a journal.  3. Start counting kicks, flutters, swishes, rolls, or jabs in a 2-hour period. You should feel at least 10 movements within 2 hours.  4. If you do not feel 10 movements in 2 hours, wait 2-3 hours and count again. Look for a change in the pattern or not enough counts in 2 hours.  SEEK MEDICAL CARE IF:  · You feel less than 10 counts in 2 hours, tried twice.  · There is no movement in over an hour.  · The pattern is changing or taking longer each day to reach 10 counts in 2 hours.  · You feel the baby is not moving as he or she usually does.  Date: ____________ Movements: ____________ Start time: ____________ Finish time: ____________   Date: ____________ Movements: ____________ Start time: ____________ Finish time: ____________  Date: ____________ Movements: ____________ Start time: ____________ Finish time: ____________  Date: ____________ Movements:  ____________ Start time: ____________ Finish time: ____________  Date: ____________ Movements: ____________ Start time: ____________ Finish time: ____________  Date: ____________ Movements: ____________ Start time: ____________ Finish time: ____________  Date: ____________ Movements: ____________ Start time: ____________ Finish time: ____________  Date: ____________ Movements: ____________ Start time: ____________ Finish time: ____________   Date: ____________ Movements: ____________ Start time: ____________ Finish time: ____________  Date: ____________ Movements: ____________ Start time: ____________ Finish time: ____________  Date: ____________ Movements: ____________ Start time: ____________ Finish time: ____________  Date: ____________ Movements: ____________ Start time: ____________ Finish time: ____________  Date: ____________ Movements: ____________ Start time: ____________ Finish time: ____________  Date: ____________ Movements: ____________ Start time: ____________ Finish time: ____________  Date: ____________ Movements: ____________ Start time: ____________ Finish time: ____________   Date: ____________ Movements: ____________ Start time: ____________ Finish time: ____________  Date: ____________ Movements: ____________ Start time: ____________ Finish time: ____________  Date: ____________ Movements: ____________ Start time: ____________ Finish time: ____________  Date: ____________ Movements: ____________ Start time: ____________ Finish time: ____________  Date: ____________ Movements: ____________ Start time: ____________ Finish time: ____________  Date: ____________ Movements: ____________ Start time: ____________ Finish time: ____________  Date: ____________ Movements: ____________ Start time: ____________ Finish time: ____________   Date: ____________ Movements: ____________ Start time: ____________ Finish time: ____________  Date: ____________ Movements: ____________ Start time: ____________ Finish  time: ____________  Date: ____________ Movements: ____________ Start time: ____________ Finish time: ____________  Date: ____________ Movements: ____________ Start time:   ____________ Finish time: ____________  Date: ____________ Movements: ____________ Start time: ____________ Finish time: ____________  Date: ____________ Movements: ____________ Start time: ____________ Finish time: ____________  Date: ____________ Movements: ____________ Start time: ____________ Finish time: ____________   Date: ____________ Movements: ____________ Start time: ____________ Finish time: ____________  Date: ____________ Movements: ____________ Start time: ____________ Finish time: ____________  Date: ____________ Movements: ____________ Start time: ____________ Finish time: ____________  Date: ____________ Movements: ____________ Start time: ____________ Finish time: ____________  Date: ____________ Movements: ____________ Start time: ____________ Finish time: ____________  Date: ____________ Movements: ____________ Start time: ____________ Finish time: ____________  Date: ____________ Movements: ____________ Start time: ____________ Finish time: ____________   Date: ____________ Movements: ____________ Start time: ____________ Finish time: ____________  Date: ____________ Movements: ____________ Start time: ____________ Finish time: ____________  Date: ____________ Movements: ____________ Start time: ____________ Finish time: ____________  Date: ____________ Movements: ____________ Start time: ____________ Finish time: ____________  Date: ____________ Movements: ____________ Start time: ____________ Finish time: ____________  Date: ____________ Movements: ____________ Start time: ____________ Finish time: ____________  Date: ____________ Movements: ____________ Start time: ____________ Finish time: ____________   Date: ____________ Movements: ____________ Start time: ____________ Finish time: ____________  Date: ____________  Movements: ____________ Start time: ____________ Finish time: ____________  Date: ____________ Movements: ____________ Start time: ____________ Finish time: ____________  Date: ____________ Movements: ____________ Start time: ____________ Finish time: ____________  Date: ____________ Movements: ____________ Start time: ____________ Finish time: ____________  Date: ____________ Movements: ____________ Start time: ____________ Finish time: ____________  Date: ____________ Movements: ____________ Start time: ____________ Finish time: ____________   Date: ____________ Movements: ____________ Start time: ____________ Finish time: ____________  Date: ____________ Movements: ____________ Start time: ____________ Finish time: ____________  Date: ____________ Movements: ____________ Start time: ____________ Finish time: ____________  Date: ____________ Movements: ____________ Start time: ____________ Finish time: ____________  Date: ____________ Movements: ____________ Start time: ____________ Finish time: ____________  Date: ____________ Movements: ____________ Start time: ____________ Finish time: ____________     This information is not intended to replace advice given to you by your health care provider. Make sure you discuss any questions you have with your health care provider.     Document Released: 03/04/2006 Document Revised: 02/23/2014 Document Reviewed: 11/30/2011  Elsevier Interactive Patient Education ©2016 [employer]

## 2015-07-08 NOTE — Progress Notes (Signed)
Subjective:  Savannah Wade is a 25 y.o. G2P0010 at 2334w2d being seen today for ongoing prenatal care.  She is currently monitored for the following issues for this low-risk pregnancy and has Normal pregnancy in first trimester; Psoriasis; and ADD (attention deficit disorder) on her problem list.  Patient reports no complaints.  Contractions: Irritability. Vag. Bleeding: None.  Movement: Present. Denies leaking of fluid.   The following portions of the patient's history were reviewed and updated as appropriate: allergies, current medications, past family history, past medical history, past social history, past surgical history and problem list. Problem list updated.  Objective:   Filed Vitals:   07/08/15 0840  BP: 109/61  Pulse: 91  Weight: 174 lb (78.926 kg)    Fetal Status: Fetal Heart Rate (bpm): 147 Fundal Height: 34 cm Movement: Present     General:  Alert, oriented and cooperative. Patient is in no acute distress.  Skin: Skin is warm and dry. No rash noted.   Cardiovascular: Normal heart rate noted  Respiratory: Normal respiratory effort, no problems with respiration noted  Abdomen: Soft, gravid, appropriate for gestational age. Pain/Pressure: Absent     Pelvic: Vag. Bleeding: None Vag D/C Character: Thin   Cervical exam deferred        Extremities: Normal range of motion.  Edema: None  Mental Status: Normal mood and affect. Normal behavior. Normal judgment and thought content.   Urinalysis: Urine Protein: Negative Urine Glucose: Negative  Assessment and Plan:  Pregnancy: G2P0010 at 7634w2d  1. Normal pregnancy in first trimester   Preterm labor symptoms and general obstetric precautions including but not limited to vaginal bleeding, contractions, leaking of fluid and fetal movement were reviewed in detail with the patient. Please refer to After Visit Summary for other counseling recommendations.  Return in about 2 weeks (around 07/22/2015).   Dorathy KinsmanVirginia Eickhoff, CNM

## 2015-07-22 ENCOUNTER — Ambulatory Visit (INDEPENDENT_AMBULATORY_CARE_PROVIDER_SITE_OTHER): Payer: BLUE CROSS/BLUE SHIELD | Admitting: Advanced Practice Midwife

## 2015-07-22 VITALS — BP 99/59 | HR 99 | Wt 176.0 lb

## 2015-07-22 DIAGNOSIS — Z3483 Encounter for supervision of other normal pregnancy, third trimester: Secondary | ICD-10-CM

## 2015-07-22 DIAGNOSIS — Z3491 Encounter for supervision of normal pregnancy, unspecified, first trimester: Secondary | ICD-10-CM

## 2015-07-22 NOTE — Progress Notes (Signed)
Pt feels like baby has dropped into pelvis

## 2015-07-22 NOTE — Progress Notes (Signed)
Subjective:  Savannah Wade is a [age] y.o. G2P0010 at 603w2d being seen today for ongoing prenatal care.  She is currently monitored for the following issues for this low-risk pregnancy and has Normal pregnancy in first trimester; Psoriasis; and ADD (attention deficit disorder) on her problem list.  Patient reports feelign like the baby is lowign in her pelvis, rare mild contractions. .  Contractions: Irritability. Vag. Bleeding: None.  Movement: Present. Denies leaking of fluid.   The following portions of the patient's history were reviewed and updated as appropriate: allergies, current medications, past family history, past medical history, past social history, past surgical history and problem list. Problem list updated.  Objective:   Filed Vitals:   07/22/15 0803  BP: 99/59  Pulse: 99  Weight: 176 lb (79.833 kg)    Fetal Status: Fetal Heart Rate (bpm): 142   Movement: Present     General:  Alert, oriented and cooperative. Patient is in no acute distress.  Skin: Skin is warm and dry. No rash noted.   Cardiovascular: Normal heart rate noted  Respiratory: Normal respiratory effort, no problems with respiration noted  Abdomen: Soft, gravid, appropriate for gestational age. Pain/Pressure: Absent     Pelvic: Vag. Bleeding: None Vag D/C Character: Thin   Cervical exam deferred        Extremities: Normal range of motion.  Edema: Trace  Mental Status: Normal mood and affect. Normal behavior. Normal judgment and thought content.   Urinalysis: Urine Protein: Negative Urine Glucose: Negative  Assessment and Plan:  Pregnancy: G2P0010 at 573w2d  1. Normal pregnancy in first trimester   Preterm labor symptoms and general obstetric precautions including but not limited to vaginal bleeding, contractions, leaking of fluid and fetal movement were reviewed in detail with the patient. Please refer to After Visit Summary for other counseling recommendations.  Return in about 2 weeks (around  08/05/2015).   [name], CNM

## 2015-07-22 NOTE — Patient Instructions (Signed)
Braxton Hicks Contractions °Contractions of the uterus can occur throughout pregnancy. Contractions are not always a sign that you are in labor.  °WHAT ARE BRAXTON HICKS CONTRACTIONS?  °Contractions that occur before labor are called Braxton Hicks contractions, or false labor. Toward the end of pregnancy (32-34 weeks), these contractions can develop more often and may become more forceful. This is not true labor because these contractions do not result in opening (dilatation) and thinning of the cervix. They are sometimes difficult to tell apart from true labor because these contractions can be forceful and people have different pain tolerances. You should not feel embarrassed if you go to the hospital with false labor. Sometimes, the only way to tell if you are in true labor is for your health care provider to look for changes in the cervix. °If there are no prenatal problems or other health problems associated with the pregnancy, it is completely safe to be sent home with false labor and await the onset of true labor. °HOW CAN YOU TELL THE DIFFERENCE BETWEEN TRUE AND FALSE LABOR? °False Labor °· The contractions of false labor are usually shorter and not as hard as those of true labor.   °· The contractions are usually irregular.   °· The contractions are often felt in the front of the lower abdomen and in the groin.   °· The contractions may go away when you walk around or change positions while lying down.   °· The contractions get weaker and are shorter lasting as time goes on.   °· The contractions do not usually become progressively stronger, regular, and closer together as with true labor.   °True Labor °· Contractions in true labor last 30-70 seconds, become very regular, usually become more intense, and increase in frequency.   °· The contractions do not go away with walking.   °· The discomfort is usually felt in the top of the uterus and spreads to the lower abdomen and low back.   °· True labor can be  determined by your health care provider with an exam. This will show that the cervix is dilating and getting thinner.   °WHAT TO REMEMBER °· Keep up with your usual exercises and follow other instructions given by your health care provider.   °· Take medicines as directed by your health care provider.   °· Keep your regular prenatal appointments.   °· Eat and drink lightly if you think you are going into labor.   °· If Braxton Hicks contractions are making you uncomfortable:   °¨ Change your position from lying down or resting to walking, or from walking to resting.   °¨ Sit and rest in a tub of warm water.   °¨ Drink 2-3 glasses of water. Dehydration may cause these contractions.   °¨ Do slow and deep breathing several times an hour.   °WHEN SHOULD I SEEK IMMEDIATE MEDICAL CARE? °Seek immediate medical care if: °· Your contractions become stronger, more regular, and closer together.   °· You have fluid leaking or gushing from your vagina.   °· You have a fever.   °· You pass blood-tinged mucus.   °· You have vaginal bleeding.   °· You have continuous abdominal pain.   °· You have low back pain that you never had before.   °· You feel your baby's head pushing down and causing pelvic pressure.   °· Your baby is not moving as much as it used to.   °  °This information is not intended to replace advice given to you by your health care provider. Make sure you discuss any questions you have with your health care   provider. °  °Document Released: 02/02/2005 Document Revised: 02/07/2013 Document Reviewed: 11/14/2012 °Elsevier Interactive Patient Education ©2016 [employer] ° °

## 2015-08-07 ENCOUNTER — Ambulatory Visit (INDEPENDENT_AMBULATORY_CARE_PROVIDER_SITE_OTHER): Payer: BLUE CROSS/BLUE SHIELD | Admitting: Obstetrics & Gynecology

## 2015-08-07 VITALS — BP 106/64 | HR 109 | Wt 178.0 lb

## 2015-08-07 DIAGNOSIS — Z3483 Encounter for supervision of other normal pregnancy, third trimester: Secondary | ICD-10-CM

## 2015-08-07 DIAGNOSIS — Z3493 Encounter for supervision of normal pregnancy, unspecified, third trimester: Secondary | ICD-10-CM

## 2015-08-07 DIAGNOSIS — Z36 Encounter for antenatal screening of mother: Secondary | ICD-10-CM | POA: Diagnosis not present

## 2015-08-07 DIAGNOSIS — Z113 Encounter for screening for infections with a predominantly sexual mode of transmission: Secondary | ICD-10-CM

## 2015-08-07 DIAGNOSIS — Z3491 Encounter for supervision of normal pregnancy, unspecified, first trimester: Secondary | ICD-10-CM

## 2015-08-07 NOTE — Progress Notes (Signed)
Subjective:  Savannah Wade is a 25 y.o. G2P0010 at 7635w4d being seen today for ongoing prenatal care.  She is currently monitored for the following issues for this low-risk pregnancy and has Normal pregnancy in first trimester; Psoriasis; and ADD (attention deficit disorder) on her problem list.  Patient reports no complaints.  Contractions: Irritability. Vag. Bleeding: None.  Mvmt:  present Denies leaking of fluid.   The following portions of the patient's history were reviewed and updated as appropriate: allergies, current medications, past family history, past medical history, past social history, past surgical history and problem list. Problem list updated.  Objective:   Filed Vitals:   08/07/15 0908  BP: 106/64  Pulse: 109  Weight: 178 lb (80.74 kg)    General:  Alert, oriented and cooperative. Patient is in no acute distress.  Skin: Skin is warm and dry. No rash noted.   Cardiovascular: Normal heart rate noted  Respiratory: Normal respiratory effort, no problems with respiration noted  Abdomen: Soft, gravid, appropriate for gestational age. Pain/Pressure: Present     Pelvic: Cervical exam performed        Extremities: Normal range of motion.  Edema: Trace  Mental Status: Normal mood and affect. Normal behavior. Normal judgment and thought content.   Urinalysis: Urine Protein: Negative Urine Glucose: Negative  Assessment and Plan:  Pregnancy: G2P0010 at 135w4d  1. Normal pregnancy, third trimester -Will need bedside US to confirm vertex at next visit if still closed.  [place]--feels VTX approx 6 lbs - Urine cytology ancillary only - Culture, beta strep (group b only)  Term labor symptoms and general obstetric precautions including but not limited to vaginal bleeding, contractions, leaking of fluid and fetal movement were reviewed in detail with the patient. Please refer to After Visit Summary for other counseling recommendations.  Return in about 1 week (around  08/14/2015).   Lesly DukesKelly H Almarosa Bohac, MD

## 2015-08-08 LAB — URINE CYTOLOGY ANCILLARY ONLY
CHLAMYDIA, DNA PROBE: NEGATIVE
Neisseria Gonorrhea: NEGATIVE

## 2015-08-08 LAB — CULTURE, BETA STREP (GROUP B ONLY)

## 2015-08-09 ENCOUNTER — Inpatient Hospital Stay (HOSPITAL_COMMUNITY)
Admission: AD | Admit: 2015-08-09 | Payer: BLUE CROSS/BLUE SHIELD | Source: Ambulatory Visit | Admitting: Obstetrics & Gynecology

## 2015-08-13 LAB — OB RESULTS CONSOLE GBS: STREP GROUP B AG: POSITIVE

## 2015-08-14 ENCOUNTER — Encounter: Payer: BLUE CROSS/BLUE SHIELD | Admitting: Obstetrics & Gynecology

## 2015-08-14 ENCOUNTER — Ambulatory Visit (INDEPENDENT_AMBULATORY_CARE_PROVIDER_SITE_OTHER): Payer: BLUE CROSS/BLUE SHIELD | Admitting: Obstetrics & Gynecology

## 2015-08-14 ENCOUNTER — Encounter: Payer: Self-pay | Admitting: Obstetrics & Gynecology

## 2015-08-14 VITALS — BP 110/71 | HR 96 | Temp 98.0°F | Wt 179.0 lb

## 2015-08-14 DIAGNOSIS — Z3491 Encounter for supervision of normal pregnancy, unspecified, first trimester: Secondary | ICD-10-CM

## 2015-08-14 DIAGNOSIS — F909 Attention-deficit hyperactivity disorder, unspecified type: Secondary | ICD-10-CM

## 2015-08-14 DIAGNOSIS — Z3483 Encounter for supervision of other normal pregnancy, third trimester: Secondary | ICD-10-CM

## 2015-08-14 DIAGNOSIS — F988 Other specified behavioral and emotional disorders with onset usually occurring in childhood and adolescence: Secondary | ICD-10-CM

## 2015-08-14 NOTE — Patient Instructions (Signed)

## 2015-08-14 NOTE — Progress Notes (Signed)
Subjective:  Savannah Wade is a 25 y.o. G2P0010 at 248w4d being seen today for ongoing prenatal care.  She is currently monitored for the following issues for this low-risk pregnancy and has Normal pregnancy in first trimester; Psoriasis; and ADD (attention deficit disorder) on her problem list.  Patient reports no complaints.  Contractions: Irritability. Vag. Bleeding: None.  Movement: Present. Denies leaking of fluid.   The following portions of the patient's history were reviewed and updated as appropriate: allergies, current medications, past family history, past medical history, past social history, past surgical history and problem list. Problem list updated.  Objective:   Filed Vitals:   08/14/15 1349  BP: 110/71  Pulse: 96  Temp: 98 F (36.7 C)  Weight: 179 lb (81.194 kg)    Fetal Status: Fetal Heart Rate (bpm): 139 Fundal Height: 40 cm Movement: Present     General:  Alert, oriented and cooperative. Patient is in no acute distress.  Skin: Skin is warm and dry. No rash noted.   Cardiovascular: Normal heart rate noted  Respiratory: Normal respiratory effort, no problems with respiration noted  Abdomen: Soft, gravid, appropriate for gestational age. Pain/Pressure: Present     Pelvic: Cervical exam performed        Extremities: Normal range of motion.  Edema: Trace  Mental Status: Normal mood and affect. Normal behavior. Normal judgment and thought content.   Urinalysis: Urine Protein: Negative Urine Glucose: Negative  Assessment and Plan:  Pregnancy: G2P0010 at 6448w4d  1. Normal pregnancy in first trimester Reviewed labor precautions  2. ADD (attention deficit disorder)   Term labor symptoms and general obstetric precautions including but not limited to vaginal bleeding, contractions, leaking of fluid and fetal movement were reviewed in detail with the patient. Please refer to After Visit Summary for other counseling recommendations.  Return in about 1 week (around  08/21/2015).   Willodean Rosenthalarolyn Harraway-Gulino, MD

## 2015-08-16 ENCOUNTER — Encounter: Payer: Self-pay | Admitting: *Deleted

## 2015-08-22 ENCOUNTER — Ambulatory Visit (INDEPENDENT_AMBULATORY_CARE_PROVIDER_SITE_OTHER): Payer: 59 | Admitting: Obstetrics & Gynecology

## 2015-08-22 ENCOUNTER — Encounter: Payer: Self-pay | Admitting: Obstetrics & Gynecology

## 2015-08-22 VITALS — BP 115/71 | HR 105 | Wt 181.0 lb

## 2015-08-22 DIAGNOSIS — R319 Hematuria, unspecified: Secondary | ICD-10-CM | POA: Diagnosis not present

## 2015-08-22 DIAGNOSIS — F909 Attention-deficit hyperactivity disorder, unspecified type: Secondary | ICD-10-CM

## 2015-08-22 DIAGNOSIS — Z3483 Encounter for supervision of other normal pregnancy, third trimester: Secondary | ICD-10-CM

## 2015-08-22 DIAGNOSIS — Z3491 Encounter for supervision of normal pregnancy, unspecified, first trimester: Secondary | ICD-10-CM

## 2015-08-22 DIAGNOSIS — F988 Other specified behavioral and emotional disorders with onset usually occurring in childhood and adolescence: Secondary | ICD-10-CM

## 2015-08-22 NOTE — Progress Notes (Signed)
Subjective:  Savannah HurstMegan Wade is a 25 y.o. G2P0010 at 1812w5d being seen today for ongoing prenatal care.  She is currently monitored for the following issues for this low-risk pregnancy and has Normal pregnancy in first trimester; Psoriasis; and ADD (attention deficit disorder) on her problem list.  Patient reports no complaints.  Contractions: Irritability. Vag. Bleeding: None.  Movement: Present. Denies leaking of fluid.   The following portions of the patient's history were reviewed and updated as appropriate: allergies, current medications, past family history, past medical history, past social history, past surgical history and problem list. Problem list updated.  Objective:   Filed Vitals:   08/22/15 1322  BP: 115/71  Pulse: 105  Weight: 181 lb (82.101 kg)    Fetal Status: Fetal Heart Rate (bpm): 138   Movement: Present     General:  Alert, oriented and cooperative. Patient is in no acute distress.  Skin: Skin is warm and dry. No rash noted.   Cardiovascular: Normal heart rate noted  Respiratory: Normal respiratory effort, no problems with respiration noted  Abdomen: Soft, gravid, appropriate for gestational age. Pain/Pressure: Present     Pelvic:  Cervical exam deferred        Extremities: Normal range of motion.  Edema: Trace  Mental Status: Normal mood and affect. Normal behavior. Normal judgment and thought content.   Urinalysis: Urine Protein: Negative Urine Glucose: Negative  Assessment and Plan:  Pregnancy: G2P0010 at 5512w5d  1. Normal pregnancy in first trimester No problems this visit Reviewed labor precautions Reviewed timing of delivery and cervical ripening and indxn if not delivered by 41 weeks  2. ADD (attention deficit disorder)  3. Blood in urine Asymptomatic - CULTURE, URINE COMPREHENSIVE  Term labor symptoms and general obstetric precautions including but not limited to vaginal bleeding, contractions, leaking of fluid and fetal movement were reviewed in  detail with the patient. Please refer to After Visit Summary for other counseling recommendations.  No Follow-up on file.   Willodean Rosenthalarolyn Harraway-Ives, MD

## 2015-08-22 NOTE — Progress Notes (Signed)
Mod Blood.  Culture sent

## 2015-08-24 LAB — CULTURE, URINE COMPREHENSIVE
COLONY COUNT: NO GROWTH
Organism ID, Bacteria: NO GROWTH

## 2015-08-28 ENCOUNTER — Ambulatory Visit (INDEPENDENT_AMBULATORY_CARE_PROVIDER_SITE_OTHER): Payer: BLUE CROSS/BLUE SHIELD | Admitting: Obstetrics & Gynecology

## 2015-08-28 VITALS — BP 106/68 | HR 89 | Wt 180.0 lb

## 2015-08-28 DIAGNOSIS — Z3483 Encounter for supervision of other normal pregnancy, third trimester: Secondary | ICD-10-CM

## 2015-08-28 DIAGNOSIS — Z3493 Encounter for supervision of normal pregnancy, unspecified, third trimester: Secondary | ICD-10-CM

## 2015-08-28 NOTE — Progress Notes (Signed)
Subjective:  Savannah Wade is a 25 y.o. G2P0010 at 4952w4d being seen today for ongoing prenatal care.  She is currently monitored for the following issues for this low-risk pregnancy and has Normal pregnancy in first trimester; Psoriasis; and ADD (attention deficit disorder) on her problem list.  Patient reports no complaints.  Contractions: Irritability. Vag. Bleeding: None.  Movement: Present. Denies leaking of fluid.   The following portions of the patient's history were reviewed and updated as appropriate: allergies, current medications, past family history, past medical history, past social history, past surgical history and problem list. Problem list updated.  Objective:   Filed Vitals:   08/28/15 0946  BP: 106/68  Pulse: 89  Weight: 180 lb (81.647 kg)    Fetal Status: Fetal Heart Rate (bpm): 137   Movement: Present     General:  Alert, oriented and cooperative. Patient is in no acute distress.  Skin: Skin is warm and dry. No rash noted.   Cardiovascular: Normal heart rate noted  Respiratory: Normal respiratory effort, no problems with respiration noted  Abdomen: Soft, gravid, appropriate for gestational age. Pain/Pressure: Present     Pelvic:  Cervical exam performed      FT/50/-3  Extremities: Normal range of motion.  Edema: Trace  Mental Status: Normal mood and affect. Normal behavior. Normal judgment and thought content.   Urinalysis: Urine Protein: Negative Urine Glucose: Negative  Assessment and Plan:  Pregnancy: G2P0010 at 9352w4d  There are no diagnoses linked to this encounter. Term labor symptoms and general obstetric precautions including but not limited to vaginal bleeding, contractions, leaking of fluid and fetal movement were reviewed in detail with the patient. Please refer to After Visit Summary for other counseling recommendations.   Twice weekly testing next week then induction at 41 weeks.  Return in about 5 days (around 09/02/2015) for Routine OB.   Lesly DukesKelly  H Josely Moffat, MD

## 2015-08-29 ENCOUNTER — Encounter: Payer: Self-pay | Admitting: *Deleted

## 2015-09-02 ENCOUNTER — Telehealth (HOSPITAL_COMMUNITY): Payer: Self-pay | Admitting: *Deleted

## 2015-09-02 ENCOUNTER — Ambulatory Visit (INDEPENDENT_AMBULATORY_CARE_PROVIDER_SITE_OTHER): Payer: Managed Care, Other (non HMO) | Admitting: Advanced Practice Midwife

## 2015-09-02 VITALS — BP 119/75 | HR 102 | Wt 180.0 lb

## 2015-09-02 DIAGNOSIS — A6009 Herpesviral infection of other urogenital tract: Secondary | ICD-10-CM | POA: Insufficient documentation

## 2015-09-02 DIAGNOSIS — O98312 Other infections with a predominantly sexual mode of transmission complicating pregnancy, second trimester: Secondary | ICD-10-CM | POA: Insufficient documentation

## 2015-09-02 DIAGNOSIS — O98513 Other viral diseases complicating pregnancy, third trimester: Secondary | ICD-10-CM

## 2015-09-02 DIAGNOSIS — O98313 Other infections with a predominantly sexual mode of transmission complicating pregnancy, third trimester: Principal | ICD-10-CM

## 2015-09-02 DIAGNOSIS — Z3483 Encounter for supervision of other normal pregnancy, third trimester: Secondary | ICD-10-CM

## 2015-09-02 DIAGNOSIS — A609 Anogenital herpesviral infection, unspecified: Secondary | ICD-10-CM

## 2015-09-02 HISTORY — DX: Other infections with a predominantly sexual mode of transmission complicating pregnancy, second trimester: O98.312

## 2015-09-02 MED ORDER — VALACYCLOVIR HCL 500 MG PO TABS: 500.0000 mg | ORAL_TABLET | Freq: Two times a day (BID) | ORAL | Status: DC

## 2015-09-02 NOTE — Patient Instructions (Signed)
Labor Induction Labor induction is when steps are taken to cause a pregnant woman to begin the labor process. Most women go into labor on their own between 37 weeks and 42 weeks of the pregnancy. When this does not happen or when there is a medical need, methods may be used to induce labor. Labor induction causes a pregnant woman's uterus to contract. It also causes the cervix to soften (ripen), open (dilate), and thin out (efface). Usually, labor is not induced before 39 weeks of the pregnancy unless there is a problem with the baby or mother.  Before inducing labor, your health care provider will consider a number of factors, including the following:  The medical condition of you and the baby.   How many weeks along you are.   The status of the baby's lung maturity.   The condition of the cervix.   The position of the baby.  WHAT ARE THE REASONS FOR LABOR INDUCTION? Labor may be induced for the following reasons:  The health of the baby or mother is at risk.   The pregnancy is overdue by 1 week or more.   The water breaks but labor does not start on its own.   The mother has a health condition or serious illness, such as high blood pressure, infection, placental abruption, or diabetes.  The amniotic fluid amounts are low around the baby.   The baby is distressed.  Convenience or wanting the baby to be born on a certain date is not a reason for inducing labor. WHAT METHODS ARE USED FOR LABOR INDUCTION? Several methods of labor induction may be used, such as:   Prostaglandin medicine. This medicine causes the cervix to dilate and ripen. The medicine will also start contractions. It can be taken by mouth or by inserting a suppository into the vagina.   Inserting a thin tube (catheter) with a balloon on the end into the vagina to dilate the cervix. Once inserted, the balloon is expanded with water, which causes the cervix to open.   Stripping the membranes. Your health  care provider separates amniotic sac tissue from the cervix, causing the cervix to be stretched and causing the release of a hormone called progesterone. This may cause the uterus to contract. It is often done during an office visit. You will be sent home to wait for the contractions to begin. You will then come in for an induction.   Breaking the water. Your health care provider makes a hole in the amniotic sac using a small instrument. Once the amniotic sac breaks, contractions should begin. This may still take hours to see an effect.   Medicine to trigger or strengthen contractions. This medicine is given through an IV access tube inserted into a vein in your arm.  All of the methods of induction, besides stripping the membranes, will be done in the hospital. Induction is done in the hospital so that you and the baby can be carefully monitored.  HOW LONG DOES IT TAKE FOR LABOR TO BE INDUCED? Some inductions can take up to 2-3 days. Depending on the cervix, it usually takes less time. It takes longer when you are induced early in the pregnancy or if this is your first pregnancy. If a mother is still pregnant and the induction has been going on for 2-3 days, either the mother will be sent home or a cesarean delivery will be needed. WHAT ARE THE RISKS ASSOCIATED WITH LABOR INDUCTION? Some of the risks of induction include:     Changes in fetal heart rate, such as too high, too low, or erratic.   Fetal distress.   Chance of infection for the mother and baby.   Increased chance of having a cesarean delivery.   Breaking off (abruption) of the placenta from the uterus (rare).   Uterine rupture (very rare).  When induction is needed for medical reasons, the benefits of induction may outweigh the risks. WHAT ARE SOME REASONS FOR NOT INDUCING LABOR? Labor induction should not be done if:   It is shown that your baby does not tolerate labor.   You have had previous surgeries on your  uterus, such as a myomectomy or the removal of fibroids.   Your placenta lies very low in the uterus and blocks the opening of the cervix (placenta previa).   Your baby is not in a head-down position.   The umbilical cord drops down into the birth canal in front of the baby. This could cut off the baby's blood and oxygen supply.   You have had a previous cesarean delivery.   There are unusual circumstances, such as the baby being extremely premature.    This information is not intended to replace advice given to you by your health care provider. Make sure you discuss any questions you have with your health care provider.   Document Released: 06/24/2006 Document Revised: 02/23/2014 Document Reviewed: 09/01/2012 Elsevier Interactive Patient Education 2016 [employer]  

## 2015-09-02 NOTE — Telephone Encounter (Signed)
Preadmission screen  

## 2015-09-02 NOTE — Progress Notes (Signed)
Subjective:  Savannah Wade is a 25 y.o. G2P0010 at 4363w2d being seen today for ongoing prenatal care.  She is currently monitored for the following issues for this low-risk pregnancy and has Normal pregnancy in first trimester; Psoriasis; ADD (attention deficit disorder); and Genital herpes complicating pregnancy, antepartum on her problem list. 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 Was not noted at previous visits and pt is not of Valtrex prophylaxis. Denies outbreak or prodrome.    Patient reports occasional contractions.  Contractions: Irritability. Vag. Bleeding: None.  Movement: Present. Denies leaking of fluid.   The following portions of the patient's history were reviewed and updated as appropriate: allergies, current medications, past family history, past medical history, past social history, past surgical history and problem list. Problem list updated.  Objective:   Filed Vitals:   09/02/15 0934  BP: 119/75  Pulse: 102  Weight: 180 lb (81.647 kg)    Fetal Status: Fetal Heart Rate (bpm): NST Fundal Height: 39 cm Movement: Present  Presentation: Vertex  General:  Alert, oriented and cooperative. Patient is in no acute distress.  Skin: Skin is warm and dry. No rash noted.   Cardiovascular: Normal heart rate noted  Respiratory: Normal respiratory effort, no problems with respiration noted  Abdomen: Soft, gravid, appropriate for gestational age. Pain/Pressure: Present     Pelvic:  Cervical exam performed Dilation: 1 Effacement (%): 50 Station: Ballotable. No lesions.   Extremities: Normal range of motion.  Edema: Trace  Mental Status: Normal mood and affect. Normal behavior. Normal judgment and thought content.   Urinalysis:      Assessment and Plan:  Pregnancy: G2P0010 at 7663w2d  1. Genital herpes affecting pregnancy in third trimester, antepartum  - valACYclovir (VALTREX) 500 MG tablet; Take 1 tablet (500 mg total) by mouth 2 (two) times daily.  Dispense: 30 tablet; Refill: 6  2.  Genital herpes complicating pregnancy, antepartum, third trimester  Term labor symptoms and general obstetric precautions including but not limited to vaginal bleeding, contractions, leaking of fluid and fetal movement were reviewed in detail with the patient. Please refer to After Visit Summary for other counseling recommendations.  IOL scheduled 09/08/15 at 12:00am. Explained that is she has an outbreak of HSV prior to delivery she will need C/S.  Return in about 6 weeks (around 10/14/2015) for Postpartum visit.   Dorathy KinsmanVirginia Beyene, CNM

## 2015-09-05 ENCOUNTER — Ambulatory Visit (INDEPENDENT_AMBULATORY_CARE_PROVIDER_SITE_OTHER): Payer: Managed Care, Other (non HMO) | Admitting: *Deleted

## 2015-09-05 VITALS — BP 119/74 | HR 91 | Wt 182.0 lb

## 2015-09-05 DIAGNOSIS — O480 Post-term pregnancy: Secondary | ICD-10-CM

## 2015-09-05 NOTE — Progress Notes (Signed)
NST reactive and AFI  12.4

## 2015-09-07 NOTE — H&P (Signed)
LABOR AND DELIVERY ADMISSION HISTORY AND PHYSICAL NOTE  Savannah Wade is a 25 y.o. female G2P0010 with IUP at [redacted]w[redacted]d by 7w [place] presenting for IOL for post dates.   She reports positive fetal movement. She denies leakage of fluid or vaginal bleeding.  Prenatal History/Complications:  Past Medical History: Past Medical History  Diagnosis Date  . Vaginal Pap smear, abnormal     Past Surgical History: Past Surgical History  Procedure Laterality Date  . Wisdom tooth extraction      Obstetrical History: OB History    Gravida Para Term Preterm AB TAB SAB Ectopic Multiple Living   2 0   1  1   0      Social History: Social History   Social History  . Marital Status: Married    Spouse Name: N/A  . Number of Children: N/A  . Years of Education: N/A   Occupational History  . retailer    Social History Main Topics  . Smoking status: Former Smoker -- 2 years    Types: Cigarettes  . Smokeless tobacco: Never Used  . Alcohol Use: No  . Drug Use: No  . Sexual Activity:    Partners: Male   Other Topics Concern  . Not on file   Social History Narrative    Family History: Family History  Problem Relation Age of Onset  . Cancer Father     testicular  . Diabetes Paternal Grandmother   . Heart attack Paternal Grandmother     bypass surgery  . Cancer - Colon Paternal Grandfather     colon  . Cancer Paternal Grandfather     prostate  . COPD Paternal Grandfather   . Heart attack Maternal Grandfather 50  . Hyperlipidemia Maternal Grandfather   . Hypertension Paternal Grandmother   . Heart attack Maternal Grandmother     x 2  . Coronary artery disease Maternal Grandmother     Allergies: Allergies  Allergen Reactions  . Sulfa Antibiotics Hives     (Not in a hospital admission)   Review of Systems   All systems reviewed and negative except as stated in HPI  Last menstrual period 11/17/2014. General appearance: alert, cooperative and no distress Lungs: clear to  auscultation bilaterally Heart: regular rate and rhythm Abdomen: soft, non-tender; bowel sounds normal Extremities: No calf swelling or tenderness Presentation: cephalic Fetal monitoring: cat 1 Uterine activity: irregular contraction     Prenatal labs: ABO, Rh: A/POS/-- (11/28 1123) Antibody: NEG (11/28 1123) Rubella: !Error! RPR: NON REAC (04/19 1011)  HBsAg: NEGATIVE (11/28 1123)  HIV: NONREACTIVE (04/19 1011)  GBS:    1 hr Glucola: 3hr GTT normal Genetic screening:  declined Anatomy US: normal  Prenatal Transfer Tool  Maternal Diabetes: No Genetic Screening: Declined Maternal Ultrasounds/Referrals: Normal Fetal Ultrasounds or other Referrals:  None Maternal Substance Abuse:  No Significant Maternal Medications:  Meds include: Other: valtrex  Significant Maternal Lab Results: Lab values include: Group B Strep positive  No results found for this or any previous visit (from the past 24 hour(s)).  Patient Active Problem List   Diagnosis Date Noted  . Genital herpes complicating pregnancy, antepartum 09/02/2015  . Psoriasis 02/13/2015  . ADD (attention deficit disorder) 02/13/2015  . Normal pregnancy in first trimester 01/14/2015    Assessment: Savannah Wade is a 25 y.o. G2P0010 at [redacted]w[redacted]d here for PDIOL.  #Labor: cytotec, consider foley #Pain: Desires epidural  #FWB: Cat 1 #ID:  GBS pos, needs PCN when in labor or pit started #  MOF: breast #MOC: condoms  Savannah Wade 09/07/2015, 8:29 PM    Admitted during EPIC downtime for upgrade.  I spoke with and examined patient and agree with resident/PA/SNM's note and plan of care.  Cheral Marker, CNM, [facility] 09/08/2015 8:32 AM

## 2015-09-08 ENCOUNTER — Inpatient Hospital Stay (HOSPITAL_COMMUNITY): Payer: Managed Care, Other (non HMO) | Admitting: Anesthesiology

## 2015-09-08 ENCOUNTER — Inpatient Hospital Stay (HOSPITAL_COMMUNITY)
Admission: RE | Admit: 2015-09-08 | Discharge: 2015-09-11 | DRG: 766 | Disposition: A | Discharge status: Home / Self Care | Payer: Managed Care, Other (non HMO) | Source: Ambulatory Visit | Attending: Obstetrics and Gynecology | Admitting: Obstetrics and Gynecology

## 2015-09-08 ENCOUNTER — Encounter (HOSPITAL_COMMUNITY): Payer: Self-pay

## 2015-09-08 DIAGNOSIS — Z3A41 41 weeks gestation of pregnancy: Secondary | ICD-10-CM

## 2015-09-08 DIAGNOSIS — O480 Post-term pregnancy: Secondary | ICD-10-CM | POA: Diagnosis present

## 2015-09-08 DIAGNOSIS — O331 Maternal care for disproportion due to generally contracted pelvis: Secondary | ICD-10-CM | POA: Diagnosis present

## 2015-09-08 DIAGNOSIS — O99824 Streptococcus B carrier state complicating childbirth: Secondary | ICD-10-CM | POA: Diagnosis present

## 2015-09-08 DIAGNOSIS — O324XX0 Maternal care for high head at term, not applicable or unspecified: Secondary | ICD-10-CM | POA: Diagnosis not present

## 2015-09-08 DIAGNOSIS — O98313 Other infections with a predominantly sexual mode of transmission complicating pregnancy, third trimester: Secondary | ICD-10-CM

## 2015-09-08 DIAGNOSIS — A6009 Herpesviral infection of other urogenital tract: Secondary | ICD-10-CM

## 2015-09-08 LAB — CBC
HCT: 35.3 % — ABNORMAL LOW (ref 36.0–46.0)
Hemoglobin: 11.8 g/dL — ABNORMAL LOW (ref 12.0–15.0)
MCH: 27.8 pg (ref 26.0–34.0)
MCHC: 33.4 g/dL (ref 30.0–36.0)
MCV: 83.1 fL (ref 78.0–100.0)
Platelets: 225 10*3/uL (ref 150–400)
RBC: 4.25 MIL/uL (ref 3.87–5.11)
RDW: 16.3 % — ABNORMAL HIGH (ref 11.5–15.5)
WBC: 10.8 10*3/uL — ABNORMAL HIGH (ref 4.0–10.5)

## 2015-09-08 LAB — TYPE AND SCREEN
ABO/RH(D): A POS
ANTIBODY SCREEN: NEGATIVE

## 2015-09-08 LAB — ABO/RH: ABO/RH(D): A POS

## 2015-09-08 LAB — RPR: RPR Ser Ql: NONREACTIVE

## 2015-09-08 MED ORDER — OXYCODONE-ACETAMINOPHEN 5-325 MG PO TABS: 1.0000 | ORAL_TABLET | ORAL | Status: DC | PRN

## 2015-09-08 MED ORDER — OXYCODONE-ACETAMINOPHEN 5-325 MG PO TABS: 2.0000 | ORAL_TABLET | ORAL | Status: DC | PRN

## 2015-09-08 MED ORDER — LACTATED RINGERS IV SOLN
INTRAVENOUS | Status: DC
  Administered 2015-09-08: 22:00:00 via INTRAUTERINE

## 2015-09-08 MED ORDER — ONDANSETRON HCL 4 MG/2ML IJ SOLN: 4.0000 mg | Freq: Four times a day (QID) | INTRAMUSCULAR | Status: DC | PRN

## 2015-09-08 MED ORDER — PENICILLIN G POTASSIUM 5000000 UNITS IJ SOLR
2.5000 10*6.[IU] | INTRAMUSCULAR | Status: DC
  Administered 2015-09-08 (×2): 2.5 10*6.[IU] via INTRAVENOUS
  Filled 2015-09-08 (×6): qty 2.5

## 2015-09-08 MED ORDER — SOD CITRATE-CITRIC ACID 500-334 MG/5ML PO SOLN
30.0000 mL | ORAL | Status: DC | PRN
  Administered 2015-09-08: 30 mL via ORAL
  Filled 2015-09-08: qty 15

## 2015-09-08 MED ORDER — ACETAMINOPHEN 325 MG PO TABS: 650.0000 mg | ORAL_TABLET | ORAL | Status: DC | PRN

## 2015-09-08 MED ORDER — PENICILLIN G POTASSIUM 5000000 UNITS IJ SOLR
5.0000 10*6.[IU] | Freq: Once | INTRAMUSCULAR | Status: AC
  Administered 2015-09-08: 5 10*6.[IU] via INTRAVENOUS
  Filled 2015-09-08: qty 5

## 2015-09-08 MED ORDER — ZOLPIDEM TARTRATE 5 MG PO TABS: 5.0000 mg | ORAL_TABLET | Freq: Every evening | ORAL | Status: DC | PRN

## 2015-09-08 MED ORDER — FENTANYL 2.5 MCG/ML BUPIVACAINE 1/10 % EPIDURAL INFUSION (WH - ANES)
14.0000 mL/h | INTRAMUSCULAR | Status: DC | PRN
  Administered 2015-09-08 (×2): 14 mL/h via EPIDURAL
  Filled 2015-09-08: qty 125

## 2015-09-08 MED ORDER — MISOPROSTOL 25 MCG QUARTER TABLET: 25.0000 ug | ORAL_TABLET | ORAL | Status: DC | PRN

## 2015-09-08 MED ORDER — TERBUTALINE SULFATE 1 MG/ML IJ SOLN
0.2500 mg | Freq: Once | INTRAMUSCULAR | Status: AC | PRN
  Administered 2015-09-08: 0.25 mg via SUBCUTANEOUS
  Filled 2015-09-08: qty 1

## 2015-09-08 MED ORDER — SODIUM BICARBONATE 8.4 % IV SOLN
INTRAVENOUS | Status: DC | PRN
  Administered 2015-09-08 – 2015-09-09 (×3): 5 mL via EPIDURAL

## 2015-09-08 MED ORDER — TERBUTALINE SULFATE 1 MG/ML IJ SOLN: 0.2500 mg | Freq: Once | INTRAMUSCULAR | Status: DC | PRN

## 2015-09-08 MED ORDER — MORPHINE SULFATE (PF) 0.5 MG/ML IJ SOLN
INTRAMUSCULAR | Status: AC
  Filled 2015-09-08: qty 10

## 2015-09-08 MED ORDER — OXYTOCIN 10 UNIT/ML IJ SOLN
INTRAMUSCULAR | Status: AC
  Filled 2015-09-08: qty 4

## 2015-09-08 MED ORDER — LIDOCAINE HCL (PF) 1 % IJ SOLN: 30.0000 mL | INTRAMUSCULAR | Status: DC | PRN

## 2015-09-08 MED ORDER — EPHEDRINE 5 MG/ML INJ: 10.0000 mg | INTRAVENOUS | Status: DC | PRN

## 2015-09-08 MED ORDER — PHENYLEPHRINE 40 MCG/ML (10ML) SYRINGE FOR IV PUSH (FOR BLOOD PRESSURE SUPPORT)
80.0000 ug | PREFILLED_SYRINGE | INTRAVENOUS | Status: DC | PRN
  Administered 2015-09-09: 80 ug via INTRAVENOUS

## 2015-09-08 MED ORDER — PHENYLEPHRINE 40 MCG/ML (10ML) SYRINGE FOR IV PUSH (FOR BLOOD PRESSURE SUPPORT)
PREFILLED_SYRINGE | INTRAVENOUS | Status: AC
  Filled 2015-09-08: qty 20

## 2015-09-08 MED ORDER — FENTANYL 2.5 MCG/ML BUPIVACAINE 1/10 % EPIDURAL INFUSION (WH - ANES)
INTRAMUSCULAR | Status: AC
  Filled 2015-09-08: qty 125

## 2015-09-08 MED ORDER — OXYTOCIN BOLUS FROM INFUSION: 500.0000 mL | INTRAVENOUS | Status: DC

## 2015-09-08 MED ORDER — LIDOCAINE HCL (PF) 1 % IJ SOLN
INTRAMUSCULAR | Status: DC | PRN
  Administered 2015-09-08: 4 mL
  Administered 2015-09-08: 6 mL via EPIDURAL

## 2015-09-08 MED ORDER — MISOPROSTOL 25 MCG QUARTER TABLET
ORAL_TABLET | ORAL | Status: AC
  Administered 2015-09-08: 25 ug
  Filled 2015-09-08: qty 0.25

## 2015-09-08 MED ORDER — DEXAMETHASONE SODIUM PHOSPHATE 4 MG/ML IJ SOLN
INTRAMUSCULAR | Status: AC
  Filled 2015-09-08: qty 1

## 2015-09-08 MED ORDER — LACTATED RINGERS IV SOLN
INTRAVENOUS | Status: DC
  Administered 2015-09-08 – 2015-09-09 (×3): via INTRAVENOUS

## 2015-09-08 MED ORDER — SCOPOLAMINE 1 MG/3DAYS TD PT72
MEDICATED_PATCH | TRANSDERMAL | Status: AC
  Filled 2015-09-08: qty 1

## 2015-09-08 MED ORDER — PHENYLEPHRINE 40 MCG/ML (10ML) SYRINGE FOR IV PUSH (FOR BLOOD PRESSURE SUPPORT)
80.0000 ug | PREFILLED_SYRINGE | INTRAVENOUS | Status: DC | PRN
  Filled 2015-09-08: qty 10

## 2015-09-08 MED ORDER — OXYTOCIN 40 UNITS IN LACTATED RINGERS INFUSION - SIMPLE MED
2.5000 [IU]/h | INTRAVENOUS | Status: DC
  Filled 2015-09-08: qty 1000

## 2015-09-08 MED ORDER — OXYTOCIN 40 UNITS IN LACTATED RINGERS INFUSION - SIMPLE MED: 1.0000 m[IU]/min | INTRAVENOUS | Status: DC

## 2015-09-08 MED ORDER — OXYTOCIN 40 UNITS IN LACTATED RINGERS INFUSION - SIMPLE MED
1.0000 m[IU]/min | INTRAVENOUS | Status: DC
  Administered 2015-09-08: 1 m[IU]/min via INTRAVENOUS

## 2015-09-08 MED ORDER — DIPHENHYDRAMINE HCL 50 MG/ML IJ SOLN: 12.5000 mg | INTRAMUSCULAR | Status: DC | PRN

## 2015-09-08 MED ORDER — ONDANSETRON HCL 4 MG/2ML IJ SOLN
INTRAMUSCULAR | Status: AC
  Filled 2015-09-08: qty 4

## 2015-09-08 MED ORDER — LACTATED RINGERS IV SOLN
500.0000 mL | Freq: Once | INTRAVENOUS | Status: AC
  Administered 2015-09-08: 500 mL via INTRAVENOUS

## 2015-09-08 MED ORDER — VALACYCLOVIR HCL 500 MG PO TABS
500.0000 mg | ORAL_TABLET | Freq: Two times a day (BID) | ORAL | Status: DC
  Administered 2015-09-08 (×2): 500 mg via ORAL
  Filled 2015-09-08 (×3): qty 1

## 2015-09-08 MED ORDER — LACTATED RINGERS IV SOLN: 500.0000 mL | INTRAVENOUS | Status: DC | PRN

## 2015-09-08 NOTE — Progress Notes (Signed)
Lexys Krider is a 25 y.o. G2P0010 at [redacted]w[redacted]d by ultrasound admitted for induction of labor due to Post dates. Due date 7/15.  Subjective:   Objective: BP (!) 100/37   Pulse 88   Temp 98.2 F (36.8 C) (Oral)   Resp 20   Ht 5\' 2"  (1.575 m)   Wt 182 lb (82.6 kg)   LMP 11/17/2014   BMI 33.29 kg/m  No intake/output data recorded. No intake/output data recorded.  FHT:  FHR: 140 bpm, variability: moderate,  accelerations:  Present,  decelerations:  Absent UC:   none SVE:   Dilation: 1.5 Effacement (%): 70 Station: -3 Exam by:: Dalbert Garnet, CNM  Labs: Lab Results  Component Value Date   WBC 10.8 (H) 09/08/2015   HGB 11.8 (L) 09/08/2015   HCT 35.3 (L) 09/08/2015   MCV 83.1 09/08/2015   PLT 225 09/08/2015    Assessment / Plan: yet to be in labor  Labor: yet to be in labor Preeclampsia:  no signs or symptoms of toxicity and intake and ouput balanced Fetal Wellbeing:  Category I Pain Control:  IV pain meds I/D:  n/a Anticipated MOD:  NSVD  Wyvonnia Dusky 09/08/2015, 1:12 PM

## 2015-09-08 NOTE — Anesthesia Preprocedure Evaluation (Signed)
Anesthesia Evaluation  Patient identified by MRN, date of birth, ID band Patient awake    Reviewed: Allergy & Precautions, H&P , Patient's Chart, lab work & pertinent test results  Airway Mallampati: II  TM Distance: >3 FB Neck ROM: full    Dental no notable dental hx.    Pulmonary former smoker,    Pulmonary exam normal breath sounds clear to auscultation       Cardiovascular Exercise Tolerance: Good  Rhythm:regular Rate:Normal     Neuro/Psych    GI/Hepatic   Endo/Other    Renal/GU      Musculoskeletal   Abdominal   Peds  Hematology   Anesthesia Other Findings   Reproductive/Obstetrics                             Anesthesia Physical Anesthesia Plan  ASA: II  Anesthesia Plan: Epidural   Post-op Pain Management:    Induction:   Airway Management Planned:   Additional Equipment:   Intra-op Plan:   Post-operative Plan:   Informed Consent: I have reviewed the patients History and Physical, chart, labs and discussed the procedure including the risks, benefits and alternatives for the proposed anesthesia with the patient or authorized representative who has indicated his/her understanding and acceptance.   Dental Advisory Given  Plan Discussed with:   Anesthesia Plan Comments: (Labs checked- platelets confirmed with RN in room. Fetal heart tracing, per RN, reported to be stable enough for sitting procedure. Discussed epidural, and patient consents to the procedure:  included risk of possible headache,backache, failed block, allergic reaction, and nerve injury. This patient was asked if she had any questions or concerns before the procedure started.)        Anesthesia Quick Evaluation

## 2015-09-08 NOTE — Progress Notes (Signed)
Savannah Wade is a 25 y.o. G2P0010 at [redacted]w[redacted]d  admitted for induction of labor due to Post dates. Due date 08/31/15.  Subjective: Pt tolerating labor well, baby less so. There have been intermittent episodes of coupling of contractions triplet contractions and quadruplet contractions that result in decels that may be lates. AROM and IUPC recommended and accepted Objective: BP 124/81   Pulse (!) 127   Temp 97.8 F (36.6 C) (Oral)   Resp 20   Ht 5\' 2"  (1.575 m)   Wt 82.6 kg (182 lb)   LMP 11/17/2014   SpO2 99%   BMI 33.29 kg/m  No intake/output data recorded. No intake/output data recorded.  FHT:  FHR: 150 bpm, variability: moderate,  accelerations:  Present,  decelerations:  Present ? subtle lates vs mild variables UC:   regular, every 3 minutes SVE:   Dilation: 6 Effacement (%): 70 Station: -2 Exam by:: [employer] meconium fluid, no malodor, no fever  Labs: Lab Results  Component Value Date   WBC 10.8 (H) 09/08/2015   HGB 11.8 (L) 09/08/2015   HCT 35.3 (L) 09/08/2015   MCV 83.1 09/08/2015   PLT 225 09/08/2015    Assessment / Plan: Induction of labor due to postterm,  progressing well on pitocin  Labor: spont labor at present, will consider pitocin if baby Cat I on IUPC/FSE. Preeclampsia:   Fetal Wellbeing:  Category I Pain Control:  Epidural I/D:  n/a Anticipated MOD:  undeterminable at present, will follow closely  Landers Prajapati V 09/08/2015, 6:50 PM

## 2015-09-08 NOTE — Progress Notes (Signed)
Savannah Wade is a 25 y.o. G2P0010 at [redacted]w[redacted]d by ultrasound admitted for induction of labor due to Post dates. Due date 7/15.  Subjective:   Objective: BP 104/63   Pulse 85   Temp 97.8 F (36.6 C) (Oral)   Resp 20   Ht 5\' 2"  (1.575 m)   Wt 182 lb (82.6 kg)   LMP 11/17/2014   SpO2 99%   BMI 33.29 kg/m  No intake/output data recorded. No intake/output data recorded.  FHT:  FHR: 145 bpm, variability: moderate,  accelerations:  Present,  decelerations:  Absent UC:   regular, every 2-5 minutes SVE:   Dilation: 6 Effacement (%): 70 Station: -2 Exam by:: [employer]: Lab Results  Component Value Date   WBC 10.8 (H) 09/08/2015   HGB 11.8 (L) 09/08/2015   HCT 35.3 (L) 09/08/2015   MCV 83.1 09/08/2015   PLT 225 09/08/2015    Assessment / Plan: Induction of labor due to SROM,  progressing well on pitocin  Labor: slow progress Preeclampsia:  no signs or symptoms of toxicity and intake and ouput balanced Fetal Wellbeing:  Category I Pain Control:  Epidural I/D:  n/a Anticipated MOD:  NSVD  Wyvonnia Dusky 09/08/2015, 7:35 PM

## 2015-09-08 NOTE — Anesthesia Procedure Notes (Signed)

## 2015-09-08 NOTE — Anesthesia Pain Management Evaluation Note (Signed)
  CRNA Pain Management Visit Note  Patient: Savannah Wade, 25 y.o., female  "Hello I am a member of the anesthesia team at [facility]. We have an anesthesia team available at all times to provide care throughout the hospital, including epidural management and anesthesia for C-section. I don't know your plan for the delivery whether it a natural birth, water birth, IV sedation, nitrous supplementation, doula or epidural, but we want to meet your pain goals."   1.Was your pain managed to your expectations on prior hospitalizations?   No prior hospitalizations  2.What is your expectation for pain management during this hospitalization?     Epidural  3.How can we help you reach that goal? epidural  Record the patient's initial score and the patient's pain goal.   Pain: 0  Pain Goal: 7 [facility] wants you to be able to say your pain was always managed very well.  Cephus Shelling 09/08/2015

## 2015-09-09 ENCOUNTER — Encounter (HOSPITAL_COMMUNITY)
Admission: RE | Disposition: A | Discharge status: Home / Self Care | Payer: Self-pay | Source: Ambulatory Visit | Attending: Obstetrics & Gynecology

## 2015-09-09 DIAGNOSIS — Z3A41 41 weeks gestation of pregnancy: Secondary | ICD-10-CM

## 2015-09-09 DIAGNOSIS — O480 Post-term pregnancy: Secondary | ICD-10-CM

## 2015-09-09 DIAGNOSIS — O324XX0 Maternal care for high head at term, not applicable or unspecified: Secondary | ICD-10-CM

## 2015-09-09 DIAGNOSIS — O99824 Streptococcus B carrier state complicating childbirth: Secondary | ICD-10-CM

## 2015-09-09 DIAGNOSIS — O331 Maternal care for disproportion due to generally contracted pelvis: Secondary | ICD-10-CM | POA: Diagnosis present

## 2015-09-09 LAB — CBC
HCT: 34.4 % — ABNORMAL LOW (ref 36.0–46.0)
Hemoglobin: 11.2 g/dL — ABNORMAL LOW (ref 12.0–15.0)
MCH: 27.3 pg (ref 26.0–34.0)
MCHC: 32.6 g/dL (ref 30.0–36.0)
MCV: 83.9 fL (ref 78.0–100.0)
Platelets: 194 K/uL (ref 150–400)
RBC: 4.1 MIL/uL (ref 3.87–5.11)
RDW: 16.8 % — ABNORMAL HIGH (ref 11.5–15.5)
WBC: 15.3 K/uL — ABNORMAL HIGH (ref 4.0–10.5)

## 2015-09-09 SURGERY — Surgical Case
Anesthesia: Epidural

## 2015-09-09 MED ORDER — ONDANSETRON HCL 4 MG/2ML IJ SOLN: 4.0000 mg | Freq: Three times a day (TID) | INTRAMUSCULAR | Status: DC | PRN

## 2015-09-09 MED ORDER — PHENYLEPHRINE HCL 10 MG/ML IJ SOLN
INTRAMUSCULAR | Status: DC | PRN
  Administered 2015-09-09 (×4): 80 ug via INTRAVENOUS

## 2015-09-09 MED ORDER — NALOXONE HCL 0.4 MG/ML IJ SOLN: 0.4000 mg | INTRAMUSCULAR | Status: DC | PRN

## 2015-09-09 MED ORDER — SODIUM CHLORIDE 0.9% FLUSH: 3.0000 mL | INTRAVENOUS | Status: DC | PRN

## 2015-09-09 MED ORDER — ACETAMINOPHEN 325 MG PO TABS
650.0000 mg | ORAL_TABLET | ORAL | Status: DC | PRN
  Administered 2015-09-09: 650 mg via ORAL
  Filled 2015-09-09: qty 2

## 2015-09-09 MED ORDER — CEFAZOLIN SODIUM-DEXTROSE 2-3 GM-% IV SOLR
INTRAVENOUS | Status: DC | PRN
  Administered 2015-09-09: 2 g via INTRAVENOUS

## 2015-09-09 MED ORDER — SENNOSIDES-DOCUSATE SODIUM 8.6-50 MG PO TABS
2.0000 | ORAL_TABLET | ORAL | Status: DC
  Administered 2015-09-09 – 2015-09-11 (×2): 2 via ORAL
  Filled 2015-09-09 (×2): qty 2

## 2015-09-09 MED ORDER — SIMETHICONE 80 MG PO CHEW: 80.0000 mg | CHEWABLE_TABLET | ORAL | Status: DC | PRN

## 2015-09-09 MED ORDER — ACETAMINOPHEN 500 MG PO TABS: 1000.0000 mg | ORAL_TABLET | Freq: Four times a day (QID) | ORAL | Status: DC

## 2015-09-09 MED ORDER — LACTATED RINGERS IV SOLN
INTRAVENOUS | Status: DC
  Administered 2015-09-09: 10:00:00 via INTRAVENOUS

## 2015-09-09 MED ORDER — MENTHOL 3 MG MT LOZG: 1.0000 | LOZENGE | OROMUCOSAL | Status: DC | PRN

## 2015-09-09 MED ORDER — TETANUS-DIPHTH-ACELL PERTUSSIS 5-2.5-18.5 LF-MCG/0.5 IM SUSP: 0.5000 mL | Freq: Once | INTRAMUSCULAR | Status: DC

## 2015-09-09 MED ORDER — MEPERIDINE HCL 25 MG/ML IJ SOLN: 6.2500 mg | INTRAMUSCULAR | Status: DC | PRN

## 2015-09-09 MED ORDER — OXYTOCIN 40 UNITS IN LACTATED RINGERS INFUSION - SIMPLE MED: 2.5000 [IU]/h | INTRAVENOUS | Status: AC

## 2015-09-09 MED ORDER — PRENATAL MULTIVITAMIN CH
1.0000 | ORAL_TABLET | Freq: Every day | ORAL | Status: DC
  Administered 2015-09-09 – 2015-09-11 (×3): 1 via ORAL
  Filled 2015-09-09 (×3): qty 1

## 2015-09-09 MED ORDER — DIPHENHYDRAMINE HCL 25 MG PO CAPS: 25.0000 mg | ORAL_CAPSULE | Freq: Four times a day (QID) | ORAL | Status: DC | PRN

## 2015-09-09 MED ORDER — MORPHINE SULFATE (PF) 0.5 MG/ML IJ SOLN
INTRAMUSCULAR | Status: DC | PRN
  Administered 2015-09-09: 4 mg via EPIDURAL

## 2015-09-09 MED ORDER — NALOXONE HCL 2 MG/2ML IJ SOSY
1.0000 ug/kg/h | PREFILLED_SYRINGE | INTRAMUSCULAR | Status: DC | PRN
  Filled 2015-09-09: qty 2

## 2015-09-09 MED ORDER — IBUPROFEN 600 MG PO TABS
600.0000 mg | ORAL_TABLET | Freq: Four times a day (QID) | ORAL | Status: DC
  Administered 2015-09-09 – 2015-09-11 (×9): 600 mg via ORAL
  Filled 2015-09-09 (×9): qty 1

## 2015-09-09 MED ORDER — SCOPOLAMINE 1 MG/3DAYS TD PT72: 1.0000 | MEDICATED_PATCH | Freq: Once | TRANSDERMAL | Status: DC

## 2015-09-09 MED ORDER — CEFAZOLIN SODIUM-DEXTROSE 2-4 GM/100ML-% IV SOLN
INTRAVENOUS | Status: AC
  Filled 2015-09-09: qty 100

## 2015-09-09 MED ORDER — COCONUT OIL OIL
1.0000 "application " | TOPICAL_OIL | Status: DC | PRN
  Administered 2015-09-11: 1 via TOPICAL
  Filled 2015-09-09: qty 120

## 2015-09-09 MED ORDER — DEXAMETHASONE SODIUM PHOSPHATE 4 MG/ML IJ SOLN
INTRAMUSCULAR | Status: DC | PRN
  Administered 2015-09-09: 4 mg via INTRAVENOUS

## 2015-09-09 MED ORDER — ONDANSETRON HCL 4 MG/2ML IJ SOLN
INTRAMUSCULAR | Status: DC | PRN
  Administered 2015-09-09: 4 mg via INTRAVENOUS

## 2015-09-09 MED ORDER — SIMETHICONE 80 MG PO CHEW
80.0000 mg | CHEWABLE_TABLET | ORAL | Status: DC
  Administered 2015-09-09 – 2015-09-11 (×2): 80 mg via ORAL
  Filled 2015-09-09 (×2): qty 1

## 2015-09-09 MED ORDER — HYDROMORPHONE HCL 1 MG/ML IJ SOLN: 0.2500 mg | INTRAMUSCULAR | Status: DC | PRN

## 2015-09-09 MED ORDER — OXYCODONE-ACETAMINOPHEN 5-325 MG PO TABS
1.0000 | ORAL_TABLET | ORAL | Status: DC | PRN
  Administered 2015-09-10: 1 via ORAL
  Filled 2015-09-09: qty 1

## 2015-09-09 MED ORDER — DIBUCAINE 1 % RE OINT
1.0000 | TOPICAL_OINTMENT | RECTAL | Status: DC | PRN
Start: 2015-09-09 — End: 2015-09-11

## 2015-09-09 MED ORDER — ZOLPIDEM TARTRATE 5 MG PO TABS: 5.0000 mg | ORAL_TABLET | Freq: Every evening | ORAL | Status: DC | PRN

## 2015-09-09 MED ORDER — KETOROLAC TROMETHAMINE 30 MG/ML IJ SOLN
INTRAMUSCULAR | Status: AC
  Administered 2015-09-09: 30 mg via INTRAMUSCULAR
  Filled 2015-09-09: qty 1

## 2015-09-09 MED ORDER — NALBUPHINE HCL 10 MG/ML IJ SOLN: 5.0000 mg | INTRAMUSCULAR | Status: DC | PRN

## 2015-09-09 MED ORDER — DIPHENHYDRAMINE HCL 25 MG PO CAPS
25.0000 mg | ORAL_CAPSULE | ORAL | Status: DC | PRN
  Filled 2015-09-09: qty 1

## 2015-09-09 MED ORDER — WITCH HAZEL-GLYCERIN EX PADS: 1.0000 "application " | MEDICATED_PAD | CUTANEOUS | Status: DC | PRN

## 2015-09-09 MED ORDER — DIPHENHYDRAMINE HCL 50 MG/ML IJ SOLN: 12.5000 mg | INTRAMUSCULAR | Status: DC | PRN

## 2015-09-09 MED ORDER — OXYCODONE-ACETAMINOPHEN 5-325 MG PO TABS: 2.0000 | ORAL_TABLET | ORAL | Status: DC | PRN

## 2015-09-09 MED ORDER — SIMETHICONE 80 MG PO CHEW
80.0000 mg | CHEWABLE_TABLET | Freq: Three times a day (TID) | ORAL | Status: DC
  Administered 2015-09-09 – 2015-09-11 (×6): 80 mg via ORAL
  Filled 2015-09-09 (×6): qty 1

## 2015-09-09 MED ORDER — SCOPOLAMINE 1 MG/3DAYS TD PT72
MEDICATED_PATCH | TRANSDERMAL | Status: DC | PRN
  Administered 2015-09-09: 1 via TRANSDERMAL

## 2015-09-09 MED ORDER — KETOROLAC TROMETHAMINE 30 MG/ML IJ SOLN: 30.0000 mg | Freq: Once | INTRAMUSCULAR | Status: AC

## 2015-09-09 MED ORDER — NALBUPHINE HCL 10 MG/ML IJ SOLN
5.0000 mg | Freq: Once | INTRAMUSCULAR | Status: DC | PRN
Start: 2015-09-09 — End: 2015-09-09

## 2015-09-09 MED ORDER — NALBUPHINE HCL 10 MG/ML IJ SOLN: 5.0000 mg | Freq: Once | INTRAMUSCULAR | Status: DC | PRN

## 2015-09-09 MED ORDER — ONDANSETRON HCL 4 MG/2ML IJ SOLN
4.0000 mg | INTRAMUSCULAR | Status: DC | PRN
  Administered 2015-09-09: 4 mg via INTRAVENOUS
  Filled 2015-09-09: qty 2

## 2015-09-09 MED ORDER — OXYTOCIN 10 UNIT/ML IJ SOLN
INTRAVENOUS | Status: DC | PRN
  Administered 2015-09-09: 40 [IU] via INTRAVENOUS

## 2015-09-09 SURGICAL SUPPLY — 34 items
BENZOIN TINCTURE PRP APPL 2/3 (GAUZE/BANDAGES/DRESSINGS) IMPLANT
CHLORAPREP W/TINT 26ML (MISCELLANEOUS) ×3 IMPLANT
CLAMP CORD UMBIL (MISCELLANEOUS) IMPLANT
CLOSURE WOUND 1/2 X4 (GAUZE/BANDAGES/DRESSINGS)
CLOTH BEACON ORANGE TIMEOUT ST (SAFETY) ×3 IMPLANT
DRSG OPSITE POSTOP 4X10 (GAUZE/BANDAGES/DRESSINGS) ×3 IMPLANT
ELECT REM PT RETURN 9FT ADLT (ELECTROSURGICAL) ×3
ELECTRODE REM PT RTRN 9FT ADLT (ELECTROSURGICAL) ×1 IMPLANT
EXTRACTOR VACUUM KIWI (MISCELLANEOUS) IMPLANT
GLOVE BIO SURGEON ST LM GN SZ9 (GLOVE) ×3 IMPLANT
GLOVE BIOGEL PI IND STRL 7.0 (GLOVE) ×1 IMPLANT
GLOVE BIOGEL PI IND STRL 9 (GLOVE) ×1 IMPLANT
GLOVE BIOGEL PI INDICATOR 7.0 (GLOVE) ×2
GLOVE BIOGEL PI INDICATOR 9 (GLOVE) ×2
GOWN STRL REUS W/TWL 2XL LVL3 (GOWN DISPOSABLE) ×3 IMPLANT
GOWN STRL REUS W/TWL LRG LVL3 (GOWN DISPOSABLE) ×3 IMPLANT
NEEDLE HYPO 25X5/8 SAFETYGLIDE (NEEDLE) IMPLANT
NS IRRIG 1000ML POUR BTL (IV SOLUTION) ×3 IMPLANT
PACK C SECTION WH (CUSTOM PROCEDURE TRAY) ×3 IMPLANT
PAD OB MATERNITY 4.3X12.25 (PERSONAL CARE ITEMS) ×3 IMPLANT
PENCIL SMOKE EVAC W/HOLSTER (ELECTROSURGICAL) ×3 IMPLANT
RTRCTR C-SECT PINK 25CM LRG (MISCELLANEOUS) IMPLANT
RTRCTR C-SECT PINK 34CM XLRG (MISCELLANEOUS) IMPLANT
STRIP CLOSURE SKIN 1/2X4 (GAUZE/BANDAGES/DRESSINGS) IMPLANT
SUT MNCRL 0 VIOLET CTX 36 (SUTURE) ×2 IMPLANT
SUT MONOCRYL 0 CTX 36 (SUTURE) ×4
SUT VIC AB 0 CT1 27 (SUTURE) ×2
SUT VIC AB 0 CT1 27XBRD ANBCTR (SUTURE) ×1 IMPLANT
SUT VIC AB 2-0 CT1 27 (SUTURE) ×2
SUT VIC AB 2-0 CT1 TAPERPNT 27 (SUTURE) ×1 IMPLANT
SUT VIC AB 4-0 KS 27 (SUTURE) ×3 IMPLANT
SYR BULB IRRIGATION 50ML (SYRINGE) IMPLANT
TOWEL OR 17X24 6PK STRL BLUE (TOWEL DISPOSABLE) ×3 IMPLANT
TRAY FOLEY CATH SILVER 14FR (SET/KITS/TRAYS/PACK) ×3 IMPLANT

## 2015-09-09 NOTE — Anesthesia Postprocedure Evaluation (Signed)
Anesthesia Post Note  Patient: Savannah Wade  Procedure(s) Performed: Procedure(s) (LRB): CESAREAN SECTION (N/A)  Patient location during evaluation: Mother Baby Anesthesia Type: Epidural Level of consciousness: awake, awake and alert, oriented and patient cooperative Pain management: pain level controlled Vital Signs Assessment: post-procedure vital signs reviewed and stable Respiratory status: spontaneous breathing, nonlabored ventilation and respiratory function stable Cardiovascular status: stable Postop Assessment: no headache, patient able to bend at knees, no signs of nausea or vomiting and no backache Anesthetic complications: no     Last Vitals:  Vitals:   09/09/15 0515 09/09/15 0615  BP: (!) 96/43   Pulse: 81   Resp: 19 20  Temp: 36.8 C 36.8 C    Last Pain:  Vitals:   09/09/15 0615  TempSrc: Oral  PainSc: 0-No pain   Pain Goal: Patients Stated Pain Goal: 0 (09/09/15 0245)               Jeannie Mallinger L

## 2015-09-09 NOTE — Lactation Note (Signed)
This note was copied from a baby's chart. Lactation Consultation Note; Mom attempting to latch baby to breast when I went into room. Baby now 10 hours old and has had 5 feedings. Reports baby last fed for 30 min about 2 hours ago. Reports baby is latching well with no pain.Having a little trouble on right breast-nipple a little flat. RB has given shells and hand pump. Mom has DEBP with her and wants assistance with use before DC Baby too sleepy at this feeding- did not latch. Left skin to skin with mom. Discussed feeding cues and encouraged to feed whenever she sees them. Reviewed normal behavior the first 24 hours and cluster feeding the second night. BF brochure given with resources. Reviewed OP appointments and BFSG for support after DC. No questions at present. To call for assist prn  Patient Name: Savannah Wade [id number] Date: 09/09/2015 Reason for consult: Initial assessment   Maternal Data Formula Feeding for Exclusion: No Has patient been taught Hand Expression?: Yes Does the patient have breastfeeding experience prior to this delivery?: No  Feeding Feeding Type: Breast Fed Length of feed: 30 min  LATCH Score/Interventions Latch: Too sleepy or reluctant, no latch achieved, no sucking elicited. Intervention(s): Adjust position;Assist with latch;Breast massage;Breast compression  Audible Swallowing: None Intervention(s): Skin to skin;Hand expression  Type of Nipple: Flat Intervention(s): Hand pump;Shells  Comfort (Breast/Nipple): Soft / non-tender     Hold (Positioning): Assistance needed to correctly position infant at breast and maintain latch. Intervention(s): Breastfeeding basics reviewed;Support Pillows  LATCH Score: 4  Lactation Tools Discussed/Used     Consult Status Consult Status: Follow-up Date: 09/10/15 Follow-up type: In-patient    Pamelia Hoit 09/09/2015, 11:03 AM

## 2015-09-09 NOTE — Brief Op Note (Signed)
09/08/2015 - 09/09/2015  2:30 AM  PATIENT:  Savannah Wade  25 y.o. female  PRE-OPERATIVE DIAGNOSIS:  CESAREAN SECTION pregnancy [redacted] weeks gestation failed medical induction of labor cephalopelvic disproportion  POST-OPERATIVE DIAGNOSIS: Same, delivered  PROCEDURE:  Procedure(s): CESAREAN SECTION (N/A) low transverse cervical  SURGEON:  Surgeon(s) and Role:    * Tilda Burrow, MD - Primary  PHYSICIAN ASSISTANT:   ASSISTANTS: none   ANESTHESIA:   epidural  EBL:  Total I/O In: 1300 [I.V.:1300] Out: 1700 [Urine:1000; Blood:700]  BLOOD ADMINISTERED:none  DRAINS: Urinary Catheter (Foley)   LOCAL MEDICATIONS USED:  MARCAINE     SPECIMEN:  Source of Specimen:  Placenta to labor and delivery  DISPOSITION OF SPECIMEN:  Labor and delivery  COUNTS:  YES  TOURNIQUET:  * No tourniquets in log *  DICTATION: .Dragon Dictation  PLAN OF CARE: Has inpatient admission orders  PATIENT DISPOSITION:  PACU - hemodynamically stable.   Delay start of Pharmacological VTE agent (>24hrs) due to surgical blood loss or risk of bleeding: not applicable Indications arrest of labor at 6 cm 75% effaced -2 to -3 station for over 6 hours. Presenting part never really entered the pelvis Details of procedure: Patient was taken operating room prepped and draped for lower abdominal surgery. Fetal heart rate tracing was confirmed with some difficulty as maternal heart rate was about 104 and the patient's fetal heart rate was 104-110 and gradually slowly improved abdomen was prepped and draped preop timeout conducted and then transverse uterine incision performed promptly dissection and the fascia which was opened transversely, the peritoneum opened in the midline light of flap developed minimally transverse uterine incision was performed through the well-developed lower uterine segment and the fetal vertex guided into the incision. Amniotic fluid was light meconium discolored without any malodor. Infant had good  Apgars. See pediatrician's notes for details. Placenta was expelled in response to crede uterine massage and IV oxytocin. Uterus was irrigated and then closed in running locking 0 Monocryl first layer and continuous running second layer good reapproximation was achieved abdomen was irrigated. Peritoneum was closed anteriorly with 2-0 Vicryl fascia was closed with 0 Vicryl. She is was carefully coagulated as necessary to achieve hemostasis then reapproximated with 3 interrupted sutures of 2-0 Vicryl and subcuticular 4-0 Vicryl completed the procedure patient tolerated procedure well went to recovery in stable condition .

## 2015-09-09 NOTE — Op Note (Signed)
Please see the brief operative note for surgical details 

## 2015-09-09 NOTE — Transfer of Care (Signed)
Immediate Anesthesia Transfer of Care Note  Patient: Savannah Wade  Procedure(s) Performed: Procedure(s): CESAREAN SECTION (N/A)  Patient Location: PACU  Anesthesia Type:Epidural  Level of Consciousness: awake, alert  and oriented  Airway & Oxygen Therapy: Patient Spontanous Breathing  Post-op Assessment: Report given to RN and Post -op Vital signs reviewed and stable  Post vital signs: Reviewed and stable  Last Vitals:  Vitals:   09/09/15 0025 09/09/15 0030  BP: 104/71 110/71  Pulse: 100 98  Resp: 18   Temp:      Last Pain:  Vitals:   09/09/15 0014  TempSrc:   PainSc: 0-No pain         Complications: No apparent anesthesia complications

## 2015-09-09 NOTE — Progress Notes (Signed)
Post Partum Day 0  Subjective:  Savannah Wade is a 25 y.o. G2P0011 [redacted]w[redacted]d s/p LTCS.  No acute events overnight.  Pt denies problems with ambulating, voiding or po intake.  She denies nausea or vomiting, but has had some dry heaving which is getting better.  Pain is well controlled.  She has not had flatus. She has not had bowel movement.  Lochia Minimal.  Plan for birth control is condoms.  Method of Feeding: breast  Objective: BP (!) 96/43 (BP Location: Right Arm)   Pulse 81   Temp 98.2 F (36.8 C) (Oral)   Resp 20   Ht 5\' 2"  (1.575 m)   Wt 82.6 kg (182 lb)   LMP 11/17/2014   SpO2 96%   BMI 33.29 kg/m   Physical Exam:  General: alert, cooperative and no distress Lochia:normal flow Chest: normal working of breathing  Heart: RRR Abdomen: +BS, soft, nontender, fundus firm at/below umbilicus Honeycomb dressing is dry with minimal bleeding    Recent Labs  09/08/15 0230 09/09/15 0552  HGB 11.8* 11.2*  HCT 35.3* 34.4*    Assessment/Plan:  ASSESSMENT: Savannah Wade is a 25 y.o. G2P0011 [redacted]w[redacted]d ppd #0 s/p LTCS doing well.   POD #0  Patient stable-will stay two more days   LOS: 1 day   Bayard Hugger 09/09/2015, 7:40 AM   CNM attestation Post Partum Day #0  Savannah Wade is a 25 y.o. G2P0010 s/p pLTCS.  Pt denies problems with ambulating, voiding. Having some nausea/vomiting recently. Pain is well controlled.  Plan for birth control is condoms.  Method of Feeding: breast  PE:  BP (!) 96/43 (BP Location: Right Arm)   Pulse 81   Temp 98.2 F (36.8 C) (Oral)   Resp 20   Ht 5\' 2"  (1.575 m)   Wt 82.6 kg (182 lb)   LMP 11/17/2014   SpO2 96%   BMI 33.29 kg/m  Fundus firm  Plan for discharge:  Will give Zofran for N/V Plan to d/c on 09/11/15  Cam Hai, CNM 8:05 AM  09/09/2015

## 2015-09-09 NOTE — Progress Notes (Signed)
Savannah Wade is a 25 y.o. G2P0010 at [redacted]w[redacted]d by  admitted for induction of labor due to Post dates. Due date .  Subjective: Patient has remained at 6 cm since our exam. And over 6 hours Prognosis for vaginal delivery is minimal Objective: BP 98/62   Pulse (!) 109   Temp 98.2 F (36.8 C) (Oral)   Resp 18   Ht 5\' 2"  (1.575 m)   Wt 82.6 kg (182 lb)   LMP 11/17/2014   SpO2 99%   BMI 33.29 kg/m  No intake/output data recorded. Total I/O In: -  Out: 900 [Urine:900]  FHT:  FHR: 145 bpm, variability: moderate,  accelerations:  Present,  decelerations:  Present Lites and variables UC:   irregular, every 3-6 minutes SVE:   Dilation: 6 Effacement (%): 70, 80 Station: -2 Exam by:: Sumire Halbleib  Pelvis is very narrow and presenting part is not in the pelvis  Labs: Lab Results  Component Value Date   WBC 10.8 (H) 09/08/2015   HGB 11.8 (L) 09/08/2015   HCT 35.3 (L) 09/08/2015   MCV 83.1 09/08/2015   PLT 225 09/08/2015    Assessment / Plan: Arrest in active phase of labor  Labor: Pitocin discontinued to resolve decelerations Preeclampsia:   Fetal Wellbeing:  Category I now that Pitocin discontinued  Pain Control:  Epidural I/D:  n/a Anticipated MOD:  Cesarean section recommended and accepted explained with details including risks benefits and rationale for recommendation  Savannah Wade 09/09/2015, 12:24 AM

## 2015-09-10 ENCOUNTER — Encounter (HOSPITAL_COMMUNITY): Payer: Self-pay | Admitting: Obstetrics and Gynecology

## 2015-09-10 LAB — BIRTH TISSUE RECOVERY COLLECTION (PLACENTA DONATION)

## 2015-09-10 NOTE — Lactation Note (Signed)
This note was copied from a baby's chart. Lactation Consultation Note Follow up visit at 45 hours of age.  MBU RN request assist due to poor feedings.  Baby has 4 breast feedings recorded in the past 24 hours with 4 stools and 1 void.  RN has been encouraged mom to feed baby that has been sleepy and encouraged mom to pump.  Mom reports recent short feeding and then mom pumped and collected about 32ml.  LC advised mom to feed on demand and wake baby as needed.  LC encouraged mom to offer supplement at appetizer or after feedings to increase intake.  Mom will continue to post pump 8X/24 hours.  Discussed cluster feeding.  RN to follow up with spoon feeding. Discussed possible supplementation with formula if medically needed, RN to monitor weight loss and LC to follow tomorrow.    Patient Name: Girl Nikkisha Lipe HYQMV'H Date: 09/10/2015 Reason for consult: Follow-up assessment;Difficult latch   Maternal Data    Feeding Feeding Type: Breast Fed Length of feed: 5 min (per mom, not interested, waking attemps used. pumping)  LATCH Score/Interventions                      Lactation Tools Discussed/Used Breast pump type: Double-Electric Breast Pump   Consult Status Consult Status: Follow-up Date: 09/11/15 Follow-up type: In-patient    Jannifer Rodney 09/10/2015, 11:02 PM

## 2015-09-10 NOTE — Progress Notes (Signed)
Patient ID: Savannah Wade, female   DOB: 22-Dec-1990, 25 y.o.   MRN: [id number] Subjective: Postpartum Day 1: Cesarean Delivery  Savannah Wade is a 25 y.o. G2P0011 [redacted]w[redacted]d s/p LTCS.  No acute events overnight.  Pt denies problems with ambulating, voiding or po intake.  She denies nausea or vomiting. Pain is well controlled with  Tylenol.  She has had flatus, but no bowel movement.  Lochia Minimal. Incision site healing well. Plan for birth control is condoms.  Method of Feeding: breast  Objective: Vital signs in last 24 hours: Temp:  [97.5 F (36.4 C)-98.1 F (36.7 C)] 97.8 F (36.6 C) (07/25 0619) Pulse Rate:  [68-83] 83 (07/25 0619) Resp:  [18-20] 18 (07/25 0619) BP: (97-105)/(51-57) 97/55 (07/25 0619) SpO2:  [97 %-98 %] 98 % (07/24 1430)  Physical Exam:  General: alert, cooperative and no distress Lochia: appropriate Uterine Fundus: firm Incision: healing well, no significant drainage DVT Evaluation: No evidence of DVT seen on physical exam.   Recent Labs  09/08/15 0230 09/09/15 0552  HGB 11.8* 11.2*  HCT 35.3* 34.4*    Assessment/Plan: Status post Cesarean section. Doing well postoperatively.   Discharge home tomorrow.  Maryelizabeth Kaufmann 09/10/2015, 7:48 AM

## 2015-09-10 NOTE — Lactation Note (Signed)
This note was copied from a baby's chart. Lactation Consultation Note  Patient Name: Savannah Wade TUUEK'C Date: 09/10/2015   Infant sleeping currently after a breastfeeding attempt. Mom says that a nurse has had to come in to help her w/every feed, but she feels that is getting better (Mom has begun pre-pumping to evert her nipples some to improve latch).  Mom says she would like to try the next latch by herself, but will call for [place] to come check the latch. I wrote my phone # on the board so that Mom could call.   Lurline Hare [facility] 09/10/2015, 12:02 PM

## 2015-09-11 ENCOUNTER — Encounter (HOSPITAL_COMMUNITY): Payer: Self-pay | Admitting: Obstetrics and Gynecology

## 2015-09-11 MED ORDER — OXYCODONE-ACETAMINOPHEN 5-325 MG PO TABS: 1.0000 | ORAL_TABLET | ORAL | 0 refills | Status: DC | PRN

## 2015-09-11 MED ORDER — IBUPROFEN 600 MG PO TABS: 600.0000 mg | ORAL_TABLET | Freq: Four times a day (QID) | ORAL | 0 refills | Status: DC | PRN

## 2015-09-11 NOTE — Lactation Note (Signed)
This note was copied from a baby's chart. Lactation Consultation Note: Staff nurse page to assist mother with latch. Observed that mother independently latched infant on in football hold with good depth. Infant was observed with audible swallows for 15-20 mins . Mother taught asymetrical latch as well as breast compression. Mother advised to continue to feed infant 8-12 times in 24 hours and to do frequent skin to skin.  Patient Name: Savannah Wade Date: 09/11/2015 Reason for consult: Follow-up assessment   Maternal Data    Feeding Feeding Type: Breast Fed Length of feed: 20 min  LATCH Score/Interventions Latch: Repeated attempts needed to sustain latch, nipple held in mouth throughout feeding, stimulation needed to elicit sucking reflex.  Audible Swallowing: Spontaneous and intermittent  Type of Nipple: Everted at rest and after stimulation  Comfort (Breast/Nipple): Filling, red/small blisters or bruises, mild/mod discomfort     Hold (Positioning): Assistance needed to correctly position infant at breast and maintain latch. Intervention(s): Support Pillows;Position options  LATCH Score: 7  Lactation Tools Discussed/Used     Consult Status      Michel Bickers 09/11/2015, 3:08 PM

## 2015-09-11 NOTE — Discharge Summary (Signed)
OB Discharge Summary     Patient Name: Savannah Wade DOB: 08-05-1990 MRN: [id number]  Date of admission: 09/08/2015 Delivering MD: Tilda Burrow   Date of discharge: 09/11/2015  Admitting diagnosis: induction Intrauterine pregnancy: [redacted]w[redacted]d     Secondary diagnosis:  Active Problems:   Post-dates pregnancy   Cephalopelvic disproportion due to generally contracted pelvis  Additional problems: none     Discharge diagnosis: Term Pregnancy Delivered                                                                                                Post partum procedures:none  Augmentation: AROM, Pitocin, Cytotec and Foley Balloon  Complications: None  Hospital course:  Induction of Labor With Cesarean Section  25 y.o. yo G2P0010 at [redacted]w[redacted]d was admitted to the hospital 09/08/2015 for induction of labor due to postterm. Patient had a labor course significant for undergoing cx ripening, becoming 6cm, and then arresting in labor progress. The patient went for cesarean section due to Arrest of Dilation, and delivered a Viable infant,@BABYSUPPRESS (DBLINK,ept,110,,1,,) Membrane Rupture Time/Date: )6:30 PM ,09/08/2015    of operation can be found in separate operative Note.  Patient had an uncomplicated postpartum course. She is ambulating, tolerating a regular diet, passing flatus, and urinating well.  Patient is discharged home in stable condition on 09/11/15.                                     Physical exam Vitals:   09/09/15 2045 09/10/15 0619 09/10/15 1725 09/11/15 0538  BP: (!) 100/55 (!) 97/55 (!) 101/58 (!) 95/57  Pulse: 78 83 89 76  Resp: Temp: 97.5 F (36.4 C) 97.8 F (36.6 C) 98.4 F (36.9 C) 98.7 F (37.1 C)  TempSrc: Oral Oral Oral Oral  SpO2:      Weight:      Height:       General: alert and cooperative Lochia: appropriate Uterine Fundus: firm Incision: Healing well with no significant drainage DVT Evaluation: No evidence of DVT seen on physical  exam. Labs: Lab Results  Component Value Date   WBC 15.3 (H) 09/09/2015   HGB 11.2 (L) 09/09/2015   HCT 34.4 (L) 09/09/2015   MCV 83.9 09/09/2015   PLT 194 09/09/2015   CMP Latest Ref Rng & Units 06/12/2015  Glucose 65 - 104 mg/dL 69    Discharge instruction: per After Visit Summary and "Baby and Me Booklet".  After visit meds:    Medication List    STOP taking these medications   acetaminophen 500 MG tablet Commonly known as:  TYLENOL   valACYclovir 500 MG tablet Commonly known as:  VALTREX     TAKE these medications   ibuprofen 600 MG tablet Commonly known as:  ADVIL,MOTRIN Take 1 tablet (600 mg total) by mouth every 6 (six) hours as needed.   oxyCODONE-acetaminophen 5-325 MG tablet Commonly known as:  PERCOCET/ROXICET Take 1 tablet by mouth every 4 (four) hours as needed (pain scale 4-7).   PRENATAL  VITAMINS PO Take 1 tablet by mouth daily.       Diet: routine diet  Activity: Advance as tolerated. Pelvic rest for 6 weeks.   Outpatient follow up:6 weeks Follow up Appt:Future Appointments Date Time Provider Department Center  10/14/2015 9:30 AM Dorathy Kinsman, CNM [facility]   Follow up Visit:No Follow-up on file.  Postpartum contraception: Condoms  Newborn Data: Live born female  Birth Weight: 7 lb 6.2 oz (3350 g) APGAR: 9, 9  Baby Feeding: Breast Disposition:home with mother   09/11/2015 Cam Hai, CNM  9:35 AM

## 2015-09-11 NOTE — Lactation Note (Signed)
This note was copied from a baby's chart. Lactation Consultation Note:  infant is 80 hours old. Infant has been a poor feeding. Mother has infant latched now in cross-cradle hold. I observed frequent suckles with audible swallows. Mother reports that this is a good feeding.  Lots of teaching with mother on cue base feeding cluster feeding. Mother advised in feeding infant 8-12 times in 24 hours as well ad with feeding cues. Mother informed of treatment to prevent severe engorgement. Mother is aware to follow up with [facility] as needed. Mother to page for [facility] or staff nurse to see one more feeding before discharge.   Patient Name: Savannah Wade NOMVE'H Date: 09/11/2015 Reason for consult: Follow-up assessment   Maternal Data    Feeding Feeding Type: Breast Fed  LATCH Score/Interventions Latch: Repeated attempts needed to sustain latch, nipple held in mouth throughout feeding, stimulation needed to elicit sucking reflex. Intervention(s): Waking techniques Intervention(s): Assist with latch  Audible Swallowing: A few with stimulation Intervention(s): Hand expression  Type of Nipple: Everted at rest and after stimulation  Comfort (Breast/Nipple): Filling, red/small blisters or bruises, mild/mod discomfort  Problem noted: Mild/Moderate discomfort;Cracked, bleeding, blisters, bruises Interventions  (Cracked/bleeding/bruising/blister): Other (comment) (coconut oil)  Hold (Positioning): Assistance needed to correctly position infant at breast and maintain latch.  LATCH Score: 6  Lactation Tools Discussed/Used     Consult Status      Michel Bickers 09/11/2015, 11:33 AM

## 2015-09-11 NOTE — Progress Notes (Signed)
Patient ID: Katerina Ehrenberg, female   DOB: 1990-02-24, 25 y.o.   MRN: [id number] Subjective: Postpartum Day 2: Cesarean Delivery No acute events overnight. Patient tolerating p.o. Pain well controlled. No + flatus. No issues voiding. Patient is concerned that baby isn't drinking enough from breast. She is going to try pumping. Method of contraception: condoms.   Objective: Vital signs in last 24 hours: Temp:  [98.4 F (36.9 C)-98.7 F (37.1 C)] 98.7 F (37.1 C) (07/26 0538) Pulse Rate:  [76-89] 76 (07/26 0538) Resp:  [18] 18 (07/26 0538) BP: (95-101)/(57-58) 95/57 (07/26 0931)  Physical Exam:  General: alert, cooperative and no distress Lochia: appropriate Uterine Fundus: firm Incision: healing well, no significant drainage   Recent Labs  09/09/15 0552  HGB 11.2*  HCT 34.4*    Assessment/Plan: Status post Cesarean section. Doing well postoperatively.  Baby might need to stay one more day but will discharge mom home with standard precautions and return to clinic in 4-6 weeks.  Maryelizabeth Kaufmann 09/11/2015, 7:30 AM

## 2015-09-11 NOTE — Discharge Instructions (Signed)
Cesarean Delivery, Care After  Refer to this sheet in the next few weeks. These instructions provide you with information on caring for yourself after your procedure. Your health care provider may also give you specific instructions. Your treatment has been planned according to current medical practices, but problems sometimes occur. Call your health care provider if you have any problems or questions after you go home.  HOME CARE INSTRUCTIONS   Only take over-the-counter or prescription medications as directed by your health care provider.   Do not drink alcohol, especially if you are breastfeeding or taking medication to relieve pain.   Do not chew or smoke tobacco.   Continue to use good perineal care. Good perineal care includes:    Wiping your perineum from front to back.    Keeping your perineum clean.   Check your surgical cut (incision) daily for increased redness, drainage, swelling, or separation of skin.   Clean your incision gently with soap and water every day, and then pat it dry. If your health care provider says it is okay, leave the incision uncovered. Use a bandage (dressing) if the incision is draining fluid or appears irritated. If the adhesive strips across the incision do not fall off within 7 days, carefully peel them off.   Hug a pillow when coughing or sneezing until your incision is healed. This helps to relieve pain.   Do not use tampons or douche until your health care provider says it is okay.   Shower, wash your hair, and take tub baths as directed by your health care provider.   Wear a well-fitting bra that provides breast support.   Limit wearing support panties or control-top hose.   Drink enough fluids to keep your urine clear or pale yellow.   Eat high-fiber foods such as whole grain cereals and breads, brown rice, beans, and fresh fruits and vegetables every day. These foods may help prevent or relieve constipation.   Resume activities such as climbing stairs,  driving, lifting, exercising, or traveling as directed by your health care provider.   Talk to your health care provider about resuming sexual activities. This is dependent upon your risk of infection, your rate of healing, and your comfort and desire to resume sexual activity.   Try to have someone help you with your household activities and your newborn for at least a few days after you leave the hospital.   Rest as much as possible. Try to rest or take a nap when your newborn is sleeping.   Increase your activities gradually.   Keep all of your scheduled postpartum appointments. It is very important to keep your scheduled follow-up appointments. At these appointments, your health care provider will be checking to make sure that you are healing physically and emotionally.  SEEK MEDICAL CARE IF:    You are passing large clots from your vagina. Save any clots to show your health care provider.   You have a foul smelling discharge from your vagina.   You have trouble urinating.   You are urinating frequently.   You have pain when you urinate.   You have a change in your bowel movements.   You have increasing redness, pain, or swelling near your incision.   You have pus draining from your incision.   Your incision is separating.   You have painful, hard, or reddened breasts.   You have a severe headache.   You have blurred vision or see spots.   You feel sad   or depressed.   You have thoughts of hurting yourself or your newborn.   You have questions about your care, the care of your newborn, or medications.   You are dizzy or light-headed.   You have a rash.   You have pain, redness, or swelling at the site of the removed intravenous access (IV) tube.   You have nausea or vomiting.   You stopped breastfeeding and have not had a menstrual period within 12 weeks of stopping.   You are not breastfeeding and have not had a menstrual period within 12 weeks of delivery.   You have a fever.  SEEK  IMMEDIATE MEDICAL CARE IF:   You have persistent pain.   You have chest pain.   You have shortness of breath.   You faint.   You have leg pain.   You have stomach pain.   Your vaginal bleeding saturates 2 or more sanitary pads in 1 hour.  MAKE SURE YOU:    Understand these instructions.   Will watch your condition.   Will get help right away if you are not doing well or get worse.     This information is not intended to replace advice given to you by your health care provider. Make sure you discuss any questions you have with your health care provider.     Document Released: 10/25/2001 Document Revised: 02/23/2014 Document Reviewed: 09/30/2011  Elsevier Interactive Patient Education 2016 [employer]

## 2015-10-14 ENCOUNTER — Encounter: Payer: Self-pay | Admitting: Obstetrics & Gynecology

## 2015-10-14 ENCOUNTER — Encounter: Payer: Self-pay | Admitting: Advanced Practice Midwife

## 2015-10-14 ENCOUNTER — Ambulatory Visit (INDEPENDENT_AMBULATORY_CARE_PROVIDER_SITE_OTHER): Payer: 59 | Admitting: Advanced Practice Midwife

## 2015-10-14 DIAGNOSIS — L409 Psoriasis, unspecified: Secondary | ICD-10-CM

## 2015-10-14 DIAGNOSIS — Z98891 History of uterine scar from previous surgery: Secondary | ICD-10-CM

## 2015-10-14 DIAGNOSIS — A6000 Herpesviral infection of urogenital system, unspecified: Secondary | ICD-10-CM

## 2015-10-14 NOTE — Progress Notes (Signed)
Subjective:     Savannah HurstMegan Wade is a 25 y.o. female who presents for a postpartum visit. She is 5 weeks postpartum following a low cervical transverse Cesarean section. I have fully reviewed the prenatal and intrapartum course. The delivery was at 41 gestational weeks. Outcome: primary cesarean section, low transverse incision. Anesthesia: epidural. Postpartum course has been uncomplicated. Baby's course has been uncomplicated. Baby is feeding by both breast and bottle -  . Bleeding no bleeding. Bowel function is normal. Bladder function is normal. Patient is not sexually active. Contraception method is condoms. Postpartum depression screening: negative.  The following portions of the patient's history were reviewed and updated as appropriate: allergies, current medications, past family history, past medical history, past social history, past surgical history and problem list.  Review of Systems Pertinent items are noted in HPI.   Objective:    BP 100/68   Pulse 77   Wt 153 lb (69.4 kg)   Breastfeeding? Yes   BMI 27.98 kg/m   General:  alert, cooperative, appears stated age and no distress   Breasts:  inspection negative, no nipple discharge or bleeding, no masses or nodularity palpable  Lungs: clear to auscultation bilaterally  Heart:  regular rate and rhythm, S1, S2 normal, no murmur, click, rub or gallop  Abdomen: soft, non-tender; bowel sounds normal; no masses,  no organomegaly   Vulva:  not evaluated  Vagina: not evaluated                    Assessment:     postpartum exam. Pap smear not done at today's visit.   Plan:    1. Contraception: condoms  2. Recommend at least two years before next birth due to C/S.  3. Follow up in: 1 year or as needed.

## 2015-10-14 NOTE — Patient Instructions (Signed)
Contraception Choices Contraception (birth control) is the use of any methods or devices to prevent pregnancy. Below are some methods to help avoid pregnancy. HORMONAL METHODS   Contraceptive implant. This is a thin, plastic tube containing progesterone hormone. It does not contain estrogen hormone. Your health care provider inserts the tube in the inner part of the upper arm. The tube can remain in place for up to 3 years. After 3 years, the implant must be removed. The implant prevents the ovaries from releasing an egg (ovulation), thickens the cervical mucus to prevent sperm from entering the uterus, and thins the lining of the inside of the uterus.  Progesterone-only injections. These injections are given every 3 months by your health care provider to prevent pregnancy. This synthetic progesterone hormone stops the ovaries from releasing eggs. It also thickens cervical mucus and changes the uterine lining. This makes it harder for sperm to survive in the uterus.  Birth control pills. These pills contain estrogen and progesterone hormone. They work by preventing the ovaries from releasing eggs (ovulation). They also cause the cervical mucus to thicken, preventing the sperm from entering the uterus. Birth control pills are prescribed by a health care provider.Birth control pills can also be used to treat heavy periods.  Minipill. This type of birth control pill contains only the progesterone hormone. They are taken every day of each month and must be prescribed by your health care provider.  Birth control patch. The patch contains hormones similar to those in birth control pills. It must be changed once a week and is prescribed by a health care provider.  Vaginal ring. The ring contains hormones similar to those in birth control pills. It is left in the vagina for 3 weeks, removed for 1 week, and then a new one is put back in place. The patient must be comfortable inserting and removing the ring  from the vagina.A health care provider's prescription is necessary.  Emergency contraception. Emergency contraceptives prevent pregnancy after unprotected sexual intercourse. This pill can be taken right after sex or up to 5 days after unprotected sex. It is most effective the sooner you take the pills after having sexual intercourse. Most emergency contraceptive pills are available without a prescription. Check with your pharmacist. Do not use emergency contraception as your only form of birth control. BARRIER METHODS   Female condom. This is a thin sheath (latex or rubber) that is worn over the penis during sexual intercourse. It can be used with spermicide to increase effectiveness.  Female condom. This is a soft, loose-fitting sheath that is put into the vagina before sexual intercourse.  Diaphragm. This is a soft, latex, dome-shaped barrier that must be fitted by a health care provider. It is inserted into the vagina, along with a spermicidal jelly. It is inserted before intercourse. The diaphragm should be left in the vagina for 6 to 8 hours after intercourse.  Cervical cap. This is a round, soft, latex or plastic cup that fits over the cervix and must be fitted by a health care provider. The cap can be left in place for up to 48 hours after intercourse.  Sponge. This is a soft, circular piece of polyurethane foam. The sponge has spermicide in it. It is inserted into the vagina after wetting it and before sexual intercourse.  Spermicides. These are chemicals that kill or block sperm from entering the cervix and uterus. They come in the form of creams, jellies, suppositories, foam, or tablets. They do not require a   prescription. They are inserted into the vagina with an applicator before having sexual intercourse. The process must be repeated every time you have sexual intercourse. INTRAUTERINE CONTRACEPTION  Intrauterine device (IUD). This is a T-shaped device that is put in a woman's uterus  during a menstrual period to prevent pregnancy. There are 2 types:  Copper IUD. This type of IUD is wrapped in copper wire and is placed inside the uterus. Copper makes the uterus and fallopian tubes produce a fluid that kills sperm. It can stay in place for 10 years.  Hormone IUD. This type of IUD contains the hormone progestin (synthetic progesterone). The hormone thickens the cervical mucus and prevents sperm from entering the uterus, and it also thins the uterine lining to prevent implantation of a fertilized egg. The hormone can weaken or kill the sperm that get into the uterus. It can stay in place for 3-5 years, depending on which type of IUD is used. PERMANENT METHODS OF CONTRACEPTION  Female tubal ligation. This is when the woman's fallopian tubes are surgically sealed, tied, or blocked to prevent the egg from traveling to the uterus.  Hysteroscopic sterilization. This involves placing a small coil or insert into each fallopian tube. Your doctor uses a technique called hysteroscopy to do the procedure. The device causes scar tissue to form. This results in permanent blockage of the fallopian tubes, so the sperm cannot fertilize the egg. It takes about 3 months after the procedure for the tubes to become blocked. You must use another form of birth control for these 3 months.  Female sterilization. This is when the female has the tubes that carry sperm tied off (vasectomy).This blocks sperm from entering the vagina during sexual intercourse. After the procedure, the man can still ejaculate fluid (semen). NATURAL PLANNING METHODS  Natural family planning. This is not having sexual intercourse or using a barrier method (condom, diaphragm, cervical cap) on days the woman could become pregnant.  Calendar method. This is keeping track of the length of each menstrual cycle and identifying when you are fertile.  Ovulation method. This is avoiding sexual intercourse during ovulation.  Symptothermal  method. This is avoiding sexual intercourse during ovulation, using a thermometer and ovulation symptoms.  Post-ovulation method. This is timing sexual intercourse after you have ovulated. Regardless of which type or method of contraception you choose, it is important that you use condoms to protect against the transmission of sexually transmitted infections (STIs). Talk with your health care provider about which form of contraception is most appropriate for you.   This information is not intended to replace advice given to you by your health care provider. Make sure you discuss any questions you have with your health care provider.   Document Released: 02/02/2005 Document Revised: 02/07/2013 Document Reviewed: 07/28/2012 Elsevier Interactive Patient Education 2016 Elsevier Inc.  

## 2015-10-16 ENCOUNTER — Encounter: Payer: Self-pay | Admitting: Advanced Practice Midwife

## 2015-10-16 DIAGNOSIS — Z98891 History of uterine scar from previous surgery: Secondary | ICD-10-CM | POA: Insufficient documentation

## 2016-02-24 ENCOUNTER — Encounter: Payer: Self-pay | Admitting: Obstetrics & Gynecology

## 2016-02-24 ENCOUNTER — Ambulatory Visit (INDEPENDENT_AMBULATORY_CARE_PROVIDER_SITE_OTHER): Payer: 59 | Admitting: Obstetrics & Gynecology

## 2016-02-24 VITALS — BP 98/61 | HR 87 | Ht 62.0 in | Wt 151.0 lb

## 2016-02-24 DIAGNOSIS — R21 Rash and other nonspecific skin eruption: Secondary | ICD-10-CM | POA: Diagnosis not present

## 2016-02-24 DIAGNOSIS — Z23 Encounter for immunization: Secondary | ICD-10-CM | POA: Diagnosis not present

## 2016-02-24 NOTE — Addendum Note (Signed)
Addended by: Granville LewisLARK, Mary Secord L on: 02/24/2016 02:04 PM   Modules accepted: Orders

## 2016-02-24 NOTE — Progress Notes (Signed)
   Subjective:    Patient ID: Savannah HurstMegan Wade, female    DOB: Jul 09, 1990, 26 y.o.   MRN: [id number]  HPI 26 yo lady s/p PLTCS about 6 months ago here with the issue of an itchy rash on her right breast, below the nipple for the last month. She has tried some mycolog cream which has not cured the issue. She is breastfeeding.  Review of Systems     Objective:   Physical Exam [id number] Breathing, conversing, and ambulating normally A patchy rash about 3x3 cm, below the nipple       Assessment & Plan:  Breast rash- treat with diflucan, benadry cream She may repeat the diflucan in 3 days/prn If no better by next week, then rec dermatology

## 2016-03-10 ENCOUNTER — Other Ambulatory Visit: Payer: Self-pay

## 2016-03-10 MED ORDER — FLUCONAZOLE 150 MG PO TABS
150.0000 mg | ORAL_TABLET | Freq: Once | ORAL | 0 refills | Status: AC
Start: 2016-03-10 — End: 2016-03-10

## 2016-03-10 NOTE — Telephone Encounter (Signed)
Savannah MilletMegan was seen on 02/24/2016 for a rash on her breast. Dr. Marice Potterove wanted it to be treated with Diflucan. However, the Diflucan was never sent to her pharmacy. I reviewed Dr.Dove's note and sent in the Diflucan.

## 2017-01-11 ENCOUNTER — Other Ambulatory Visit: Payer: Self-pay

## 2017-01-11 ENCOUNTER — Encounter (HOSPITAL_COMMUNITY): Payer: Self-pay | Admitting: *Deleted

## 2017-01-11 ENCOUNTER — Inpatient Hospital Stay (HOSPITAL_COMMUNITY)
Admission: AD | Admit: 2017-01-11 | Discharge: 2017-01-11 | Disposition: A | Discharge status: Home / Self Care | Payer: 59 | Source: Ambulatory Visit | Attending: Obstetrics & Gynecology | Admitting: Obstetrics & Gynecology

## 2017-01-11 DIAGNOSIS — O209 Hemorrhage in early pregnancy, unspecified: Secondary | ICD-10-CM

## 2017-01-11 DIAGNOSIS — O26891 Other specified pregnancy related conditions, first trimester: Secondary | ICD-10-CM | POA: Diagnosis present

## 2017-01-11 DIAGNOSIS — Z3A01 Less than 8 weeks gestation of pregnancy: Secondary | ICD-10-CM | POA: Insufficient documentation

## 2017-01-11 DIAGNOSIS — R109 Unspecified abdominal pain: Secondary | ICD-10-CM | POA: Insufficient documentation

## 2017-01-11 LAB — URINALYSIS, ROUTINE W REFLEX MICROSCOPIC
Bilirubin Urine: NEGATIVE
Glucose, UA: NEGATIVE mg/dL
KETONES UR: NEGATIVE mg/dL
Nitrite: NEGATIVE
PH: 6 (ref 5.0–8.0)
Protein, ur: 30 mg/dL — AB
Specific Gravity, Urine: 1.003 — ABNORMAL LOW (ref 1.005–1.030)

## 2017-01-11 LAB — WET PREP, GENITAL
Clue Cells Wet Prep HPF POC: NONE SEEN
SPERM: NONE SEEN
TRICH WET PREP: NONE SEEN
YEAST WET PREP: NONE SEEN

## 2017-01-11 LAB — CBC
HEMATOCRIT: 42.3 % (ref 36.0–46.0)
Hemoglobin: 13.8 g/dL (ref 12.0–15.0)
MCH: 30 pg (ref 26.0–34.0)
MCHC: 32.6 g/dL (ref 30.0–36.0)
MCV: 92 fL (ref 78.0–100.0)
Platelets: 241 10*3/uL (ref 150–400)
RBC: 4.6 MIL/uL (ref 3.87–5.11)
RDW: 14 % (ref 11.5–15.5)
WBC: 4.8 10*3/uL (ref 4.0–10.5)

## 2017-01-11 LAB — POCT PREGNANCY, URINE: PREG TEST UR: NEGATIVE

## 2017-01-11 LAB — HCG, QUANTITATIVE, PREGNANCY: hCG, Beta Chain, Quant, S: 18 m[IU]/mL — ABNORMAL HIGH (ref <5)

## 2017-01-11 NOTE — Discharge Instructions (Signed)
Human Chorionic Gonadotropin Test °Human chorionic gonadotropin (hCG) is a hormone produced during pregnancy by the cells that form the placenta. The placenta is the organ that grows inside your womb (uterus) to nourish a developing baby. When you are pregnant, hCG starts to appear in your blood about 11 days after conception. It continues to go up for the first 8-11 weeks of pregnancy. °Your hCG level can be measured with several different types of tests. You may have: °· A urine test. °? hCG is eliminated from your body by your kidneys, so a urine test is one way to check for this hormone. °? A urine test only shows whether there is hCG in your urine. It does not measure how much. °? You may have a urine test to find out whether you are pregnant. °? A home pregnancy test detects whether there is hCG in your urine. °· A qualitative blood test. °? Like the urine test, this blood test only shows whether there is hCG in your blood. It does not measure how much. °? You may have this type of blood test to find out whether you are pregnant. °· A quantitative blood test. °? This type of blood test measures the amount of hCG in your blood. °? You may have this type of test to diagnose an abnormal pregnancy or determine whether you are at risk of, or have had, a failed pregnancy (miscarriage). ° °How do I prepare for this test? °For the urine test: °· Limit your fluid intake before the urine test as directed by your health care provider. °· Collect the sample the first time you urinate in the morning. °· Let your health care provider know if you have blood in your urine. This may interfere with the test result. ° °Some medicines may interfere with the urine and blood tests. Let your health care provider know about all the medicines you are taking. No additional preparation is required for the blood test. °What do the results mean? °It is your responsibility to obtain your test results. Ask the lab or department performing  the test when and how you will get your results. Talk to your health care provider if you have any questions about your test results. °The results of the hCG urine test and the qualitative hCG blood test are either positive or negative. The results of the quantitative hCG blood test are reported as a number. hCG is measured in international units per liter (IU/L). °Meaning of Negative Test Results °A negative result on a urine or qualitative blood test could mean that you are not pregnant. It could also mean the test was done too early to detect hCG. If you still have other signs of pregnancy, the test should be repeated. °Meaning of Positive Test Results °A positive result on the urine or qualitative blood tests means you are most likely pregnant. Your health care provider may confirm your pregnancy with an imaging study of the inside of your uterus at 5-6 weeks (ultrasound). °Range of Normal Values °Ranges for normal values for the quantitative hCG blood test may vary among different labs and hospitals. You should always check with your health care provider after having lab work or other tests done to discuss whether your values are considered within normal limits. °· Less than 5 IU/L means it is most likely you are not pregnant. °· Greater than 25 IU/L means it is most likely you are pregnant. ° °Meaning of Results Outside Normal Value Ranges °If your hCG   level on the quantitative test is not what would be expected, you may have the test again. It may also be important for your health care provider to know whether your hCG level goes up or down over time. Common causes of results outside the normal range include:  Being pregnant with twins (hCG level is higher than expected).  Having an ectopic pregnancy (hCG rises more slowly than expected).  Miscarriage (hCG level falls).  Abnormal growths in the womb (hCG level is higher than expected).  Talk with your health care provider to discuss your results,  treatment options, and if necessary, the need for more tests. Talk with your health care provider if you have any questions about your results. This information is not intended to replace advice given to you by your health care provider. Make sure you discuss any questions you have with your health care provider. Document Released: 03/06/2004 Document Revised: 10/09/2015 Document Reviewed: 05/09/2013 Elsevier Interactive Patient Education  2018 [employer]   Vaginal Bleeding During Pregnancy, First Trimester A small amount of bleeding (spotting) from the vagina is common in early pregnancy. Sometimes the bleeding is normal and is not a problem, and sometimes it is a sign of something serious. Be sure to tell your doctor about any bleeding from your vagina right away. Follow these instructions at home:  Watch your condition for any changes.  Follow your doctor's instructions about how active you can be.  If you are on bed rest: ? You may need to stay in bed and only get up to use the bathroom. ? You may be allowed to do some activities. ? If you need help, make plans for someone to help you.  Write down: ? The number of pads you use each day. ? How often you change pads. ? How soaked (saturated) your pads are.  Do not use tampons.  Do not douche.  Do not have sex or orgasms until your doctor says it is okay.  If you pass any tissue from your vagina, save the tissue so you can show it to your doctor.  Only take medicines as told by your doctor.  Do not take aspirin because it can make you bleed.  Keep all follow-up visits as told by your doctor. Contact a doctor if:  You bleed from your vagina.  You have cramps.  You have labor pains.  You have a fever that does not go away after you take medicine. Get help right away if:  You have very bad cramps in your back or belly (abdomen).  You pass large clots or tissue from your vagina.  You bleed more.  You feel  light-headed or weak.  You pass out (faint).  You have chills.  You are leaking fluid or have a gush of fluid from your vagina.  You pass out while pooping (having a bowel movement). This information is not intended to replace advice given to you by your health care provider. Make sure you discuss any questions you have with your health care provider. Document Released: 06/19/2013 Document Revised: 07/11/2015 Document Reviewed: 10/10/2012 Elsevier Interactive Patient Education  [employer]2018 Elsevier Inc.

## 2017-01-11 NOTE — MAU Note (Signed)
Back to triage, asked to use restroom, prior to questions.

## 2017-01-11 NOTE — MAU Provider Note (Signed)
Chief Complaint: Vaginal Bleeding; Abdominal Pain; and Possible Pregnancy   First Provider Initiated Contact with Patient 01/11/17 [phone]        SUBJECTIVE HPI: Savannah HurstMegan Wade is a 26 y.o. G2P1011 at 1556w6d by LMP who presents to maternity admissions reporting bleeding since this morning. Had cramping yesterday.  Had 4 positive tests a few days ago.  Were not really trying but not preventing.  Planning on conceiving next year. She denies vaginal itching/burning, urinary symptoms, h/a, dizziness, n/v, or fever/chills.     Vaginal Bleeding  The patient's primary symptoms include pelvic pain and vaginal bleeding. The patient's pertinent negatives include no genital itching, genital lesions or genital odor. This is a new problem. The current episode started yesterday. The problem occurs intermittently. The pain is mild. She is pregnant. Associated symptoms include abdominal pain. Pertinent negatives include no constipation, diarrhea, dysuria, headaches, nausea or vomiting. The vaginal discharge was bloody. The vaginal bleeding is spotting. She has not been passing clots. She has not been passing tissue. Nothing aggravates the symptoms. She has tried nothing for the symptoms. She uses nothing for contraception. Her menstrual history has been regular.  Abdominal Pain  This is a new problem. The current episode started yesterday. The pain is located in the suprapubic region. The pain is mild. The quality of the pain is cramping. Pertinent negatives include no constipation, diarrhea, dysuria, headaches, nausea or vomiting.  Possible Pregnancy  This is a new problem. The current episode started in the past 7 days. Associated symptoms include abdominal pain. Pertinent negatives include no headaches, nausea or vomiting.    RN note: Cramping yesterday, started bleeding around noon. Not really heavy, hasn't passed any clots or tissue. 1st appt is 12/11.    Past Medical History:  Diagnosis Date  . Vaginal Pap  smear, abnormal    Past Surgical History:  Procedure Laterality Date  . CESAREAN SECTION N/A 09/09/2015   Procedure: CESAREAN SECTION;  Surgeon: Tilda BurrowJohn V Ferguson, MD;  [facility];  Service: Obstetrics;  Laterality: N/A;  . WISDOM TOOTH EXTRACTION     Social History   Socioeconomic History  . Marital status: Married    Spouse name: Not on file  . Number of children: Not on file  . Years of education: Not on file  . Highest education level: Not on file  Social Needs  . Financial resource strain: Not on file  . Food insecurity - worry: Not on file  . Food insecurity - inability: Not on file  . Transportation needs - medical: Not on file  . Transportation needs - non-medical: Not on file  Occupational History  . Occupation: Surveyor, miningretailer  Tobacco Use  . Smoking status: Former Smoker    Years: 2.00    Types: Cigarettes  . Smokeless tobacco: Never Used  Substance and Sexual Activity  . Alcohol use: Yes    Alcohol/week: 0.0 oz    Comment: occasionally  . Drug use: No  . Sexual activity: Yes    Partners: Male  Other Topics Concern  . Not on file  Social History Narrative  . Not on file   No current facility-administered medications on file prior to encounter.    Current Outpatient Medications on File Prior to Encounter  Medication Sig Dispense Refill  . ibuprofen (ADVIL,MOTRIN) 600 MG tablet Take 1 tablet (600 mg total) by mouth every 6 (six) hours as needed. (Patient not taking: Reported on 02/24/2016) 30 tablet 0  . oxyCODONE-acetaminophen (PERCOCET/ROXICET) 5-325 MG tablet Take  1 tablet by mouth every 4 (four) hours as needed (pain scale 4-7). (Patient not taking: Reported on 02/24/2016) 50 tablet 0  . Prenatal Multivit-Min-Fe-FA (PRENATAL VITAMINS PO) Take 1 tablet by mouth daily.      Allergies  Allergen Reactions  . Sulfa Antibiotics Hives    I have reviewed patient's Past Medical Hx, Surgical Hx, Family Hx, Social Hx, medications and allergies.   ROS:   Review of Systems  Gastrointestinal: Positive for abdominal pain. Negative for constipation, diarrhea, nausea and vomiting.  Genitourinary: Positive for pelvic pain and vaginal bleeding. Negative for dysuria.  Neurological: Negative for headaches.   Review of Systems  Other systems negative   Physical Exam  Physical Exam Patient Vitals for the past 24 hrs:  BP Temp Temp src Pulse Resp SpO2 Weight  01/11/17 0943 106/63 97.9 F (36.6 C) Oral 76 16 100 % 155 lb 12 oz (70.6 kg)   Constitutional: Well-developed, well-nourished female in no acute distress.  Cardiovascular: normal rate Respiratory: normal effort GI: Abd soft, non-tender. Pos BS x 4 MS: Extremities nontender, no edema, normal ROM Neurologic: Alert and oriented x 4.  GU: Neg CVAT.  PELVIC EXAM: Cervix pink, visually closed, without lesion, scant pink discharge, vaginal walls and external genitalia normal Bimanual exam: Cervix 0/long/high, firm, anterior, neg CMT, uterus nontender, nonenlarged, adnexa without tenderness, enlargement, or mass   LAB RESULTS Results for orders placed or performed during the hospital encounter of 01/11/17 (from the past 72 hour(s))  Urinalysis, Routine w reflex microscopic     Status: Abnormal   Collection Time: 01/11/17  9:35 AM  Result Value Ref Range   Color, Urine YELLOW YELLOW   APPearance CLEAR CLEAR   Specific Gravity, Urine 1.003 (L) 1.005 - 1.030   pH 6.0 5.0 - 8.0   Glucose, UA NEGATIVE NEGATIVE mg/dL   Hgb urine dipstick LARGE (A) NEGATIVE   Bilirubin Urine NEGATIVE NEGATIVE   Ketones, ur NEGATIVE NEGATIVE mg/dL   Protein, ur 30 (A) NEGATIVE mg/dL   Nitrite NEGATIVE NEGATIVE   Leukocytes, UA SMALL (A) NEGATIVE   RBC / HPF TOO NUMEROUS TO COUNT 0 - 5 RBC/hpf   WBC, UA TOO NUMEROUS TO COUNT 0 - 5 WBC/hpf   Bacteria, UA RARE (A) NONE SEEN   Squamous Epithelial / LPF 0-5 (A) NONE SEEN   Mucus PRESENT   Pregnancy, urine POC     Status: None   Collection Time: 01/11/17   9:49 AM  Result Value Ref Range   Preg Test, Ur NEGATIVE NEGATIVE    Comment:        THE SENSITIVITY OF THIS METHODOLOGY IS >24 mIU/mL   Wet prep, genital     Status: Abnormal   Collection Time: 01/11/17 10:00 AM  Result Value Ref Range   Yeast Wet Prep HPF POC NONE SEEN NONE SEEN   Trich, Wet Prep NONE SEEN NONE SEEN   Clue Cells Wet Prep HPF POC NONE SEEN NONE SEEN   WBC, Wet Prep HPF POC FEW (A) NONE SEEN    Comment: MODERATE BACTERIA SEEN   Sperm NONE SEEN   CBC     Status: None   Collection Time: 01/11/17 10:24 AM  Result Value Ref Range   WBC 4.8 4.0 - 10.5 K/uL   RBC 4.60 3.87 - 5.11 MIL/uL   Hemoglobin 13.8 12.0 - 15.0 g/dL   HCT 40.9 81.1 - 91.4 %   MCV 92.0 78.0 - 100.0 fL   MCH 30.0 26.0 - 34.0  pg   MCHC 32.6 30.0 - 36.0 g/dL   RDW 16.114.0 09.611.5 - 04.515.5 %   Platelets 241 150 - 400 K/uL  hCG, quantitative, pregnancy     Status: Abnormal   Collection Time: 01/11/17 10:24 AM  Result Value Ref Range   hCG, Beta Chain, Quant, S 18 (H) <5 mIU/mL    Comment:          GEST. AGE      CONC.  (mIU/mL)   <=1 WEEK        5 - 50     2 WEEKS       50 - 500     3 WEEKS       100 - 10,000     4 WEEKS     1,000 - 30,000     5 WEEKS     3,500 - 115,000   6-8 WEEKS     12,000 - 270,000    12 WEEKS     15,000 - 220,000        FEMALE AND NON-PREGNANT FEMALE:     LESS THAN 5 mIU/mL        IMAGING No results found.  MAU Management/MDM: Ordered usual first trimester r/o ectopic labs.   Pelvic exam and cultures done Will check baseline Ultrasound to rule out ectopic if HCG high enough  This bleeding/pain can represent a normal pregnancy with bleeding, spontaneous abortion or even an ectopic which can be life-threatening.  The process as listed above helps to determine which of these is present.  HCG is low.  Explains the negative urine pregnancy test DIscussed this likely represents a failing pregnancy, since UPTs were positive last week. Patient tearful Discussed we should  repeat HCG next week  ASSESSMENT Pregnancy at 6491w6d Positive UPT a week ago with HCG 18 today Likely falling HCG levels Probable inevitable spontaneous abortion  PLAN Discharge home Plan to repeat HCG level in a week  [facility] Will repeat  Ultrasound in about 7-10 days if HCG levels double appropriately  Ectopic precautions   Pt stable at time of discharge. Encouraged to return here or to other Urgent Care/ED if she develops worsening of symptoms, increase in pain, fever, or other concerning symptoms.    Wynelle BourgeoisMarie Quantisha Marsicano CNM, MSN Certified Nurse-Midwife 01/11/2017  9:53 AM

## 2017-01-11 NOTE — MAU Note (Signed)
Cramping yesterday, started bleeding around noon. Not really heavy, hasn't passed any clots or tissue. 1st appt is 12/11.

## 2017-01-12 ENCOUNTER — Encounter: Payer: Self-pay | Admitting: Advanced Practice Midwife

## 2017-01-12 ENCOUNTER — Telehealth: Payer: Self-pay

## 2017-01-12 LAB — GC/CHLAMYDIA PROBE AMP (~~LOC~~) NOT AT ARMC
Chlamydia: NEGATIVE
Neisseria Gonorrhea: NEGATIVE

## 2017-01-12 NOTE — Telephone Encounter (Signed)
Left message for patient to call office to schedule her lab appt in 1 week. Received message from [name],  [facility].

## 2017-01-26 ENCOUNTER — Encounter: Payer: 59 | Admitting: Obstetrics and Gynecology

## 2017-03-01 ENCOUNTER — Other Ambulatory Visit (INDEPENDENT_AMBULATORY_CARE_PROVIDER_SITE_OTHER): Payer: BLUE CROSS/BLUE SHIELD

## 2017-03-01 DIAGNOSIS — Z3200 Encounter for pregnancy test, result unknown: Secondary | ICD-10-CM

## 2017-03-02 ENCOUNTER — Telehealth: Payer: Self-pay

## 2017-03-02 ENCOUNTER — Telehealth: Payer: Self-pay | Admitting: *Deleted

## 2017-03-02 LAB — HCG, QUANTITATIVE, PREGNANCY: HCG, Total, QN: 1802 m[IU]/mL

## 2017-03-02 NOTE — Telephone Encounter (Signed)
Attempted to contact pt concerning her BHCG results.  It does look like this is a new pregnancy and mostly [redacted] weeks GA.  Will repeat HCG tomorrow and discuss with pt.

## 2017-03-02 NOTE — Telephone Encounter (Signed)
Spoke with pt and she is aware that her HCG level is rising and it shows she is about [redacted] weeks pregnant. Pt is aware to come back tomorrow to draw blood again.

## 2017-03-03 ENCOUNTER — Other Ambulatory Visit: Payer: BLUE CROSS/BLUE SHIELD

## 2017-03-03 DIAGNOSIS — Z3200 Encounter for pregnancy test, result unknown: Secondary | ICD-10-CM | POA: Diagnosis not present

## 2017-03-04 ENCOUNTER — Telehealth: Payer: Self-pay | Admitting: *Deleted

## 2017-03-04 LAB — HCG, QUANTITATIVE, PREGNANCY: HCG, Total, QN: 3859 m[IU]/mL

## 2017-03-04 NOTE — Telephone Encounter (Signed)
Pt notified of BHCG results that doubled appropriately.  NOB is scheduled for 03/24/17 with Dr Penne LashLeggett.

## 2017-03-23 ENCOUNTER — Encounter: Payer: Self-pay | Admitting: *Deleted

## 2017-03-24 ENCOUNTER — Ambulatory Visit (INDEPENDENT_AMBULATORY_CARE_PROVIDER_SITE_OTHER): Payer: BLUE CROSS/BLUE SHIELD | Admitting: Obstetrics & Gynecology

## 2017-03-24 ENCOUNTER — Encounter: Payer: Self-pay | Admitting: Obstetrics & Gynecology

## 2017-03-24 VITALS — BP 105/67 | HR 85 | Wt 158.0 lb

## 2017-03-24 DIAGNOSIS — Z124 Encounter for screening for malignant neoplasm of cervix: Secondary | ICD-10-CM

## 2017-03-24 DIAGNOSIS — Z3687 Encounter for antenatal screening for uncertain dates: Secondary | ICD-10-CM | POA: Diagnosis not present

## 2017-03-24 DIAGNOSIS — Z3481 Encounter for supervision of other normal pregnancy, first trimester: Secondary | ICD-10-CM | POA: Diagnosis not present

## 2017-03-24 DIAGNOSIS — Z23 Encounter for immunization: Secondary | ICD-10-CM | POA: Diagnosis not present

## 2017-03-24 DIAGNOSIS — Z3480 Encounter for supervision of other normal pregnancy, unspecified trimester: Secondary | ICD-10-CM

## 2017-03-24 DIAGNOSIS — A6000 Herpesviral infection of urogenital system, unspecified: Secondary | ICD-10-CM

## 2017-03-24 DIAGNOSIS — Z113 Encounter for screening for infections with a predominantly sexual mode of transmission: Secondary | ICD-10-CM

## 2017-03-24 DIAGNOSIS — Z3490 Encounter for supervision of normal pregnancy, unspecified, unspecified trimester: Secondary | ICD-10-CM | POA: Insufficient documentation

## 2017-03-24 DIAGNOSIS — Z1151 Encounter for screening for human papillomavirus (HPV): Secondary | ICD-10-CM

## 2017-03-24 DIAGNOSIS — Z3400 Encounter for supervision of normal first pregnancy, unspecified trimester: Secondary | ICD-10-CM

## 2017-03-24 NOTE — Progress Notes (Signed)
[id number] Single IUP with FHT of 156 BPM.  CRL is 15.535mm  GA is 7832w6d

## 2017-03-24 NOTE — Progress Notes (Signed)
Subjective:    Savannah Wade is a [id number] [redacted]w[redacted]d being seen today for her first obstetrical visit.  Her obstetrical history is significant for c/s for failure to progress. Patient does not intend to breast feed. Pregnancy history fully reviewed.  Patient reports fatigue and nausea.  Vitals:   03/24/17 0818  BP: 105/67  Pulse: 85  Weight: 158 lb (71.7 kg)    HISTORY: OB History  Gravida Para Term Preterm AB Living  4 1 1   1 1   SAB TAB Ectopic Multiple Live Births  1       1    # Outcome Date GA Lbr Len/2nd Weight Sex Delivery Anes PTL Lv  4 Current           3 Term 09/09/15 [redacted]w[redacted]d  7 lb 6 oz (3.345 kg)  CS-LTranv   LIV     Complications: Cephalopelvic Disproportion  2 Gravida           1 SAB              Past Medical History:  Diagnosis Date  . Vaginal Pap smear, abnormal    Past Surgical History:  Procedure Laterality Date  . CESAREAN SECTION N/A 09/09/2015   Procedure: CESAREAN SECTION;  Surgeon: Savannah Burrow, MD;  Location: [facility] BIRTHING SUITES;  Service: Obstetrics;  Laterality: N/A;  . WISDOM TOOTH EXTRACTION     Family History  Problem Relation Age of Onset  . Cancer Father        testicular  . Diabetes Paternal Grandmother   . Heart attack Paternal Grandmother        bypass surgery  . Hypertension Paternal Grandmother   . Cancer - Colon Paternal Grandfather        colon  . Cancer Paternal Grandfather        prostate  . COPD Paternal Grandfather   . Heart attack Maternal Grandfather 50  . Hyperlipidemia Maternal Grandfather   . Heart attack Maternal Grandmother        x 2  . Coronary artery disease Maternal Grandmother      Exam    Uterus:     Pelvic Exam:    Perineum: No Hemorrhoids   Vulva: normal   Vagina:  normal mucosa, normal discharge   pH: n/a   Cervix: Closed, no CMT   Adnexa: no mass, fullness, tenderness   Bony Pelvis: small  System: Breast:  normal appearance, no masses or tenderness   Skin: normal coloration and turgor, no  rashes    Neurologic: oriented, normal mood   Extremities: normal strength, tone, and muscle mass   HEENT sclera clear, anicteric, oropharynx clear, no lesions, neck supple with midline trachea, thyroid without masses and trachea midline   Mouth/Teeth mucous membranes moist, pharynx normal without lesions and dental hygiene good   Neck supple and no masses   Cardiovascular: regular rate and rhythm   Respiratory:  appears well, vitals normal, no respiratory distress, acyanotic, normal RR, chest clear, no wheezing, crepitations, rhonchi, normal symmetric air entry   Abdomen: soft, non-tender; bowel sounds normal; no masses,  no organomegaly   Urinary: urethral meatus normal      Assessment:    Pregnancy: [id number] Patient Active Problem List   Diagnosis Date Noted  . H/O cesarean section 10/16/2015  . 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 Genital herpes 09/02/2015  . Psoriasis 02/13/2015  . ADD (attention deficit disorder) 02/13/2015        Plan:  Initial labs drawn. Prenatal vitamins. Problem list reviewed and updated. Flu shot Babyscripts REDUCED SCHEDULE Refused al genetic tessting  Ultrasound discussed; fetal survey: WILL ORDER AT NEXT VISIT.  Follow up in 4 weeks (WILL BE 12 WEEKS, THEN WILL SCHEDULE AT 20 WEEKS)    Savannah LincolnKelly Kemora Wade 03/24/2017

## 2017-03-25 ENCOUNTER — Encounter: Payer: Self-pay | Admitting: Obstetrics & Gynecology

## 2017-03-25 DIAGNOSIS — O9989 Other specified diseases and conditions complicating pregnancy, childbirth and the puerperium: Secondary | ICD-10-CM

## 2017-03-25 DIAGNOSIS — Z283 Underimmunization status: Secondary | ICD-10-CM | POA: Insufficient documentation

## 2017-03-25 DIAGNOSIS — O09899 Supervision of other high risk pregnancies, unspecified trimester: Secondary | ICD-10-CM | POA: Insufficient documentation

## 2017-03-25 LAB — HEMOGLOBIN A1C
HEMOGLOBIN A1C: 4.9 %{Hb} (ref <5.7)
Mean Plasma Glucose: 94 (calc)
eAG (mmol/L): 5.2 (calc)

## 2017-03-26 LAB — CYTOLOGY - PAP
DIAGNOSIS: NEGATIVE
HPV (WINDOPATH): NOT DETECTED

## 2017-03-26 LAB — CULTURE, OB URINE

## 2017-03-26 LAB — GC/CHLAMYDIA PROBE AMP (~~LOC~~) NOT AT ARMC
Chlamydia: NEGATIVE
Neisseria Gonorrhea: NEGATIVE

## 2017-03-26 LAB — URINE CULTURE, OB REFLEX

## 2017-03-30 LAB — CYSTIC FIBROSIS DIAGNOSTIC STUDY

## 2017-03-30 LAB — OBSTETRIC PANEL
Antibody Screen: NOT DETECTED
BASOS ABS: 23 {cells}/uL (ref 0–200)
Basophils Relative: 0.4 %
Eosinophils Absolute: 239 cells/uL (ref 15–500)
Eosinophils Relative: 4.2 %
HCT: 41.5 % (ref 35.0–45.0)
HEP B S AG: NONREACTIVE
Hemoglobin: 14.1 g/dL (ref 11.7–15.5)
LYMPHS ABS: 1220 {cells}/uL (ref 850–3900)
MCH: 30.3 pg (ref 27.0–33.0)
MCHC: 34 g/dL (ref 32.0–36.0)
MCV: 89.2 fL (ref 80.0–100.0)
MPV: 10.6 fL (ref 7.5–12.5)
Monocytes Relative: 6.2 %
NEUTROS PCT: 67.8 %
Neutro Abs: 3865 cells/uL (ref 1500–7800)
PLATELETS: 227 10*3/uL (ref 140–400)
RBC: 4.65 10*6/uL (ref 3.80–5.10)
RDW: 13.2 % (ref 11.0–15.0)
RPR Ser Ql: NONREACTIVE
RUBELLA: 0.92 {index} — AB
Total Lymphocyte: 21.4 %
WBC: 5.7 10*3/uL (ref 3.8–10.8)
WBCMIX: 353 {cells}/uL (ref 200–950)

## 2017-03-30 LAB — HIV ANTIBODY (ROUTINE TESTING W REFLEX): HIV: NONREACTIVE

## 2017-04-22 ENCOUNTER — Ambulatory Visit (INDEPENDENT_AMBULATORY_CARE_PROVIDER_SITE_OTHER): Payer: BLUE CROSS/BLUE SHIELD | Admitting: Obstetrics & Gynecology

## 2017-04-22 VITALS — BP 92/51 | HR 75 | Wt 159.0 lb

## 2017-04-22 DIAGNOSIS — Z3491 Encounter for supervision of normal pregnancy, unspecified, first trimester: Secondary | ICD-10-CM

## 2017-04-22 DIAGNOSIS — Z98891 History of uterine scar from previous surgery: Secondary | ICD-10-CM

## 2017-04-22 NOTE — Progress Notes (Signed)
   PRENATAL VISIT NOTE  Subjective:  Savannah Wade is a 27 y.o. G4P1011 at 7943w0d being seen today for ongoing prenatal care.  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  Patient reports no complaints.  Contractions: Not present. Vag. Bleeding: None.  Movement: Absent. Denies leaking of fluid.   The following portions of the patient's history were reviewed and updated as appropriate: allergies, current medications, past family history, past medical history, past social history, past surgical history and problem list. Problem list updated.  Objective:   Vitals:   04/22/17 1534  BP: (!) 92/51  Pulse: 75  Weight: 159 lb (72.1 kg)    Fetal Status:     Movement: Absent     General:  Alert, oriented and cooperative. Patient is in no acute distress.  Skin: Skin is warm and dry. No rash noted.   Cardiovascular: Normal heart rate noted  Respiratory: Normal respiratory effort, no problems with respiration noted  Abdomen: Soft, gravid, appropriate for gestational age.  Pain/Pressure: Absent     Pelvic: Cervical exam deferred        Extremities: Normal range of motion.  Edema: None  Mental Status:  Normal mood and affect. Normal behavior. Normal judgment and thought content.   Assessment and Plan:  Pregnancy: G4P1011 at 9043w0d  1. Encounter for supervision of low-risk pregnancy in first trimester  - US MFM OB COMP + 14 WK; Future  2. H/O cesarean section - We discussed her options  Preterm labor symptoms and general obstetric precautions including but not limited to vaginal bleeding, contractions, leaking of fluid and fetal movement were reviewed in detail with the patient. Please refer to After Visit  Summary for other counseling recommendations.  No Follow-up on file.   Allie BossierMyra C Kaydee Magel, MD

## 2017-06-01 ENCOUNTER — Ambulatory Visit (INDEPENDENT_AMBULATORY_CARE_PROVIDER_SITE_OTHER): Payer: BLUE CROSS/BLUE SHIELD | Admitting: Obstetrics & Gynecology

## 2017-06-01 VITALS — BP 98/65 | HR 72 | Wt 164.0 lb

## 2017-06-01 DIAGNOSIS — O34219 Maternal care for unspecified type scar from previous cesarean delivery: Secondary | ICD-10-CM

## 2017-06-01 DIAGNOSIS — O98312 Other infections with a predominantly sexual mode of transmission complicating pregnancy, second trimester: Secondary | ICD-10-CM

## 2017-06-01 DIAGNOSIS — Z3492 Encounter for supervision of normal pregnancy, unspecified, second trimester: Secondary | ICD-10-CM

## 2017-06-01 DIAGNOSIS — N9089 Other specified noninflammatory disorders of vulva and perineum: Secondary | ICD-10-CM

## 2017-06-01 DIAGNOSIS — A6009 Herpesviral infection of other urogenital tract: Secondary | ICD-10-CM

## 2017-06-01 DIAGNOSIS — Z98891 History of uterine scar from previous surgery: Secondary | ICD-10-CM

## 2017-06-01 MED ORDER — VALACYCLOVIR HCL 1 G PO TABS
1000.0000 mg | ORAL_TABLET | Freq: Every day | ORAL | 12 refills | Status: AC
Start: 2017-06-01 — End: 2017-06-02

## 2017-06-01 NOTE — Progress Notes (Signed)
Pt c/o of a "vaginal tear"

## 2017-06-01 NOTE — Progress Notes (Signed)
   PRENATAL VISIT NOTE  Subjective:  Savannah Wade is a 27 y.o. G4P1011 at 3563w5d being seen today for ongoing prenatal care.  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  Patient reports This is an unscheduled visit for a vaginal sore for several weeks. This happened about 5 years ago and she was told that she has herpes. She is guessing that it came from her husband's oral cold sores. She declines HSV2 IgG testing. .  Contractions: Not present. Vag. Bleeding: None.  Movement: Present. Denies leaking of fluid.   The following portions of the patient's history were reviewed and updated as appropriate: allergies, current medications, past family history, past medical history, past social history, past surgical history and problem list. Problem list updated.  Objective:   Vitals:   06/01/17 1456  BP: 98/65  Pulse: 72  Weight: 164 lb (74.4 kg)    Fetal Status: Fetal Heart Rate (bpm): 134   Movement: Present     General:  Alert, oriented and cooperative. Patient is in no acute distress.  Skin: Skin is warm and dry. No rash noted.   Cardiovascular: Normal heart rate noted  Respiratory: Normal respiratory effort, no problems with respiration noted  Abdomen: Soft, gravid, appropriate for gestational age.  Pain/Pressure: Absent     Pelvic: Cervical exam deferred        Extremities: Normal range of motion.  Edema: None  Mental Status: Normal mood and affect. Normal behavior. Normal judgment and thought content.   Assessment and Plan:  Pregnancy: G4P1011 at 3863w5d  1. Encounter for supervision of low-risk pregnancy in second trimester   2. H/O cesarean section  3 vulvar lesion, h/o herpes- treat with  valtrex 1 gram daily. I stressed the importance of this especially after 36 weeks to prevent transmission to the baby  Preterm labor symptoms and general obstetric precautions including but not limited to vaginal bleeding, contractions, leaking of fluid and fetal movement were reviewed in detail with the patient. Please refer to After Visit Summary for other counseling recommendations.  No follow-ups on file.  Future Appointments  Date Time Provider Department Center  06/10/2017  8:15 AM [facility] US 4 [facility]    Allie BossierMyra C Annelie Boak, MD

## 2017-06-10 ENCOUNTER — Other Ambulatory Visit: Payer: Self-pay | Admitting: Obstetrics & Gynecology

## 2017-06-10 ENCOUNTER — Ambulatory Visit (HOSPITAL_COMMUNITY)
Admission: RE | Admit: 2017-06-10 | Discharge: 2017-06-10 | Disposition: A | Discharge status: Home / Self Care | Payer: BLUE CROSS/BLUE SHIELD | Source: Ambulatory Visit | Attending: Obstetrics & Gynecology | Admitting: Obstetrics & Gynecology

## 2017-06-10 DIAGNOSIS — Z363 Encounter for antenatal screening for malformations: Secondary | ICD-10-CM | POA: Diagnosis not present

## 2017-06-10 DIAGNOSIS — O34219 Maternal care for unspecified type scar from previous cesarean delivery: Secondary | ICD-10-CM | POA: Diagnosis not present

## 2017-06-10 DIAGNOSIS — Z3A19 19 weeks gestation of pregnancy: Secondary | ICD-10-CM | POA: Diagnosis not present

## 2017-06-10 DIAGNOSIS — Z3491 Encounter for supervision of normal pregnancy, unspecified, first trimester: Secondary | ICD-10-CM

## 2017-07-01 ENCOUNTER — Ambulatory Visit (INDEPENDENT_AMBULATORY_CARE_PROVIDER_SITE_OTHER): Payer: BLUE CROSS/BLUE SHIELD | Admitting: Obstetrics & Gynecology

## 2017-07-01 VITALS — BP 100/65 | HR 83 | Wt 169.0 lb

## 2017-07-01 DIAGNOSIS — Z3492 Encounter for supervision of normal pregnancy, unspecified, second trimester: Secondary | ICD-10-CM

## 2017-07-01 DIAGNOSIS — A6009 Herpesviral infection of other urogenital tract: Secondary | ICD-10-CM

## 2017-07-01 DIAGNOSIS — O34219 Maternal care for unspecified type scar from previous cesarean delivery: Secondary | ICD-10-CM

## 2017-07-01 DIAGNOSIS — O98312 Other infections with a predominantly sexual mode of transmission complicating pregnancy, second trimester: Secondary | ICD-10-CM

## 2017-07-01 DIAGNOSIS — Z98891 History of uterine scar from previous surgery: Secondary | ICD-10-CM

## 2017-07-01 NOTE — Progress Notes (Signed)
   PRENATAL VISIT NOTE  Subjective:  Savannah Wade is a 27 y.o. G4P1011 at [redacted]w[redacted]d being seen today for ongoing prenatal care.  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  Patient reports no complaints.  Contractions: Not present. Vag. Bleeding: None.  Movement: Present. Denies leaking of fluid.   The following portions of the patient's history were reviewed and updated as appropriate: allergies, current medications, past family history, past medical history, past social history, past surgical history and problem list. Problem list updated.  Objective:   Vitals:   07/01/17 1344  BP: 100/65  Pulse: 83  Weight: 169 lb (76.7 kg)    Fetal Status: Fetal Heart Rate (bpm): 136   Movement: Present     General:  Alert, oriented and cooperative. Patient is in no acute distress.  Skin: Skin is warm and dry. No rash noted.   Cardiovascular: Normal heart rate noted  Respiratory: Normal respiratory effort, no problems with respiration noted  Abdomen: Soft, gravid, appropriate for gestational age.  Pain/Pressure: Absent     Pelvic: Cervical exam deferred        Extremities: Normal range of motion.  Edema: None  Mental Status: Normal mood and affect. Normal behavior. Normal judgment and thought content.   Assessment and Plan:  Pregnancy: G4P1011 at [redacted]w[redacted]d  1. Encounter for supervision of low-risk pregnancy in second trimester - 2 hour GTT at next visit  2. Genital herpes affecting pregnancy in second trimester - start prophylaxis at 35 weeks  3. H/O cesarean section   Preterm labor symptoms and general obstetric precautions including but not limited to vaginal bleeding,  contractions, leaking of fluid and fetal movement were reviewed in detail with the patient. Please refer to After Visit Summary for other counseling recommendations.  Return in about 1 month (around 07/29/2017) for 2 hour GTT.  No future appointments.  Allie Bossier, MD

## 2017-07-28 ENCOUNTER — Ambulatory Visit (INDEPENDENT_AMBULATORY_CARE_PROVIDER_SITE_OTHER): Payer: BLUE CROSS/BLUE SHIELD | Admitting: Obstetrics & Gynecology

## 2017-07-28 VITALS — BP 103/61 | HR 80 | Wt 172.0 lb

## 2017-07-28 DIAGNOSIS — Z3492 Encounter for supervision of normal pregnancy, unspecified, second trimester: Secondary | ICD-10-CM

## 2017-07-28 DIAGNOSIS — Z23 Encounter for immunization: Secondary | ICD-10-CM | POA: Diagnosis not present

## 2017-07-28 DIAGNOSIS — Z3480 Encounter for supervision of other normal pregnancy, unspecified trimester: Secondary | ICD-10-CM

## 2017-07-28 NOTE — Progress Notes (Signed)
   PRENATAL VISIT NOTE  Subjective:  Savannah Wade is a 27 y.o. G4P1011 at 5145w6d being seen today for ongoing prenatal care.  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  Patient reports no complaints.  Contractions: Not present. Vag. Bleeding: None.  Movement: Present. Denies leaking of fluid.   The following portions of the patient's history were reviewed and updated as appropriate: allergies, current medications, past family history, past medical history, past social history, past surgical history and problem list. Problem list updated.  Objective:   Vitals:   07/28/17 0909  BP: 103/61  Pulse: 80  Weight: 172 lb (78 kg)    Fetal Status: Fetal Heart Rate (bpm): 140 Fundal Height: 25 cm Movement: Present     General:  Alert, oriented and cooperative. Patient is in no acute distress.  Skin: Skin is warm and dry. No rash noted.   Cardiovascular: Normal heart rate noted  Respiratory: Normal respiratory effort, no problems with respiration noted  Abdomen: Soft, gravid, appropriate for gestational age.  Pain/Pressure: Absent     Pelvic: Cervical exam deferred        Extremities: Normal range of motion.  Edema: None  Mental Status: Normal mood and affect. Normal behavior. Normal judgment and thought content.   Assessment and Plan:  Pregnancy: G4P1011 at 7545w6d  1. Supervision of other normal pregnancy, antepartum - TDAP - 2Hr GTT w/ 1 Hr Carpenter 75 g - CBC - HIV antibody (with reflex) - RPR  2. Encounter for supervision of low-risk pregnancy in second trimester   Preterm labor symptoms and general obstetric precautions including but not limited to vaginal  bleeding, contractions, leaking of fluid and fetal movement were reviewed in detail with the patient. Please refer to After Visit Summary for other counseling recommendations.  Return in about 3 weeks (around 08/18/2017).  Future Appointments  Date Time Provider Department Center  07/28/2017  9:30 AM Savannah Wade, Savannah Lover C, MD [facility]    Savannah BossierMyra Wade Pearla Mckinny, MD

## 2017-07-29 LAB — CBC
HEMATOCRIT: 34.7 % — AB (ref 35.0–45.0)
Hemoglobin: 11.8 g/dL (ref 11.7–15.5)
MCH: 30.1 pg (ref 27.0–33.0)
MCHC: 34 g/dL (ref 32.0–36.0)
MCV: 88.5 fL (ref 80.0–100.0)
MPV: 11.3 fL (ref 7.5–12.5)
Platelets: 235 10*3/uL (ref 140–400)
RBC: 3.92 10*6/uL (ref 3.80–5.10)
RDW: 13.4 % (ref 11.0–15.0)
WBC: 5.9 10*3/uL (ref 3.8–10.8)

## 2017-07-29 LAB — RPR: RPR Ser Ql: NONREACTIVE

## 2017-07-29 LAB — 2HR GTT W 1 HR, CARPENTER, 75 G
Glucose, 1 Hr, Gest: 164 mg/dL (ref 65–179)
Glucose, 2 Hr, Gest: 131 mg/dL (ref 65–152)
Glucose, Fasting, Gest: 76 mg/dL (ref 65–91)

## 2017-07-29 LAB — HIV ANTIBODY (ROUTINE TESTING W REFLEX): HIV 1&2 Ab, 4th Generation: NONREACTIVE

## 2017-08-25 ENCOUNTER — Encounter: Payer: Self-pay | Admitting: Obstetrics & Gynecology

## 2017-08-31 ENCOUNTER — Ambulatory Visit (INDEPENDENT_AMBULATORY_CARE_PROVIDER_SITE_OTHER): Payer: BLUE CROSS/BLUE SHIELD | Admitting: Obstetrics & Gynecology

## 2017-08-31 VITALS — BP 100/60 | HR 91 | Wt 177.0 lb

## 2017-08-31 DIAGNOSIS — A6009 Herpesviral infection of other urogenital tract: Secondary | ICD-10-CM

## 2017-08-31 DIAGNOSIS — Z3493 Encounter for supervision of normal pregnancy, unspecified, third trimester: Secondary | ICD-10-CM

## 2017-08-31 DIAGNOSIS — O98312 Other infections with a predominantly sexual mode of transmission complicating pregnancy, second trimester: Secondary | ICD-10-CM

## 2017-08-31 DIAGNOSIS — Z98891 History of uterine scar from previous surgery: Secondary | ICD-10-CM

## 2017-08-31 NOTE — Progress Notes (Signed)
   PRENATAL VISIT NOTE  Subjective:  Savannah Wade is a 27 y.o. G4P1011 at 5954w5d being seen today for ongoing prenatal care.  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  Patient reports no complaints.  Contractions: Not present. Vag. Bleeding: None.  Movement: Present. Denies leaking of fluid.   The following portions of the patient's history were reviewed and updated as appropriate: allergies, current medications, past family history, past medical history, past social history, past surgical history and problem list. Problem list updated.  Objective:   Vitals:   08/31/17 1537  BP: 100/60  Pulse: 91  Weight: 177 lb (80.3 kg)    Fetal Status: Fetal Heart Rate (bpm): 138   Movement: Present     General:  Alert, oriented and cooperative. Patient is in no acute distress.  Skin: Skin is warm and dry. No rash noted.   Cardiovascular: Normal heart rate noted  Respiratory: Normal respiratory effort, no problems with respiration noted  Abdomen: Soft, gravid, appropriate for gestational age.  Pain/Pressure: Absent     Pelvic: Cervical exam deferred        Extremities: Normal range of motion.  Edema: None  Mental Status: Normal mood and affect. Normal behavior. Normal judgment and thought content.   Assessment and Plan:  Pregnancy: G4P1011 at 6354w5d  1. Encounter for supervision of low-risk pregnancy in third trimester   2. H/O cesarean section - she would like a repeat  3. Genital herpes affecting pregnancy in second trimester - prophysaxis at 35 weeks  Preterm labor symptoms and general obstetric precautions including but not limited to vaginal bleeding,  contractions, leaking of fluid and fetal movement were reviewed in detail with the patient. Please refer to After Visit Summary for other counseling recommendations.  No follow-ups on file.  No future appointments.  Allie BossierMyra C Gerado Nabers, MD

## 2017-09-14 ENCOUNTER — Ambulatory Visit (INDEPENDENT_AMBULATORY_CARE_PROVIDER_SITE_OTHER): Payer: BLUE CROSS/BLUE SHIELD | Admitting: Obstetrics & Gynecology

## 2017-09-14 DIAGNOSIS — Z3493 Encounter for supervision of normal pregnancy, unspecified, third trimester: Secondary | ICD-10-CM

## 2017-09-14 NOTE — Progress Notes (Signed)
   PRENATAL VISIT NOTE  Subjective:  Savannah Wade is a 27 y.o. G4P1011 at 7271w5d being seen today for ongoing prenatal care.  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  Patient reports some contractions at night that can last a couple of hours then stop.  Not painful.  Contractions: Not present. Vag. Bleeding: None.  Movement: Present. Denies leaking of fluid.   The following portions of the patient's history were reviewed and updated as appropriate: allergies, current medications, past family history, past medical history, past social history, past surgical history and problem list. Problem list updated.  Objective:   Vitals:   09/14/17 1505  BP: 102/62  Pulse: 84  Weight: 180 lb (81.6 kg)    Fetal Status: Fetal Heart Rate (bpm): 132   Movement: Present     General:  Alert, oriented and cooperative. Patient is in no acute distress.  Skin: Skin is warm and dry. No rash noted.   Cardiovascular: Normal heart rate noted  Respiratory: Normal respiratory effort, no problems with respiration noted  Abdomen: Soft, gravid, appropriate for gestational age.  Pain/Pressure: Absent     Pelvic: Cervical exam performed      closed/log/tone  Extremities: Normal range of motion.  Edema: None  Mental Status: Normal mood and affect. Normal behavior. Normal judgment and thought content.   Assessment and Plan:  Pregnancy: G4P1011 at 3071w5d  There are no diagnoses linked to this encounter. Preterm labor symptoms and general obstetric precautions including but not limited to vaginal bleeding, contractions, leaking of fluid and fetal movement were reviewed in detail with the  patient. Please refer to After Visit Summary for other counseling recommendations.  Return in about 2 weeks (around 09/28/2017).  No future appointments.  Elsie LincolnKelly Aiana Nordquist, MD

## 2017-09-15 ENCOUNTER — Encounter (HOSPITAL_COMMUNITY): Payer: Self-pay

## 2017-09-16 ENCOUNTER — Encounter (HOSPITAL_COMMUNITY): Payer: Self-pay

## 2017-09-20 ENCOUNTER — Other Ambulatory Visit: Payer: Self-pay | Admitting: Obstetrics & Gynecology

## 2017-09-20 ENCOUNTER — Encounter (HOSPITAL_COMMUNITY): Payer: Self-pay

## 2017-09-20 ENCOUNTER — Ambulatory Visit (HOSPITAL_COMMUNITY)
Admission: RE | Admit: 2017-09-20 | Discharge: 2017-09-20 | Disposition: A | Discharge status: Home / Self Care | Payer: BLUE CROSS/BLUE SHIELD | Source: Ambulatory Visit | Attending: Obstetrics & Gynecology | Admitting: Obstetrics & Gynecology

## 2017-09-20 DIAGNOSIS — Z3A33 33 weeks gestation of pregnancy: Secondary | ICD-10-CM | POA: Insufficient documentation

## 2017-09-20 DIAGNOSIS — Z362 Encounter for other antenatal screening follow-up: Secondary | ICD-10-CM

## 2017-09-20 DIAGNOSIS — O34219 Maternal care for unspecified type scar from previous cesarean delivery: Secondary | ICD-10-CM

## 2017-09-20 DIAGNOSIS — O403XX0 Polyhydramnios, third trimester, not applicable or unspecified: Secondary | ICD-10-CM | POA: Diagnosis not present

## 2017-09-20 DIAGNOSIS — O26843 Uterine size-date discrepancy, third trimester: Secondary | ICD-10-CM

## 2017-09-20 DIAGNOSIS — Z3493 Encounter for supervision of normal pregnancy, unspecified, third trimester: Secondary | ICD-10-CM

## 2017-09-21 ENCOUNTER — Other Ambulatory Visit (HOSPITAL_COMMUNITY): Payer: Self-pay | Admitting: *Deleted

## 2017-09-21 ENCOUNTER — Encounter: Payer: Self-pay | Admitting: Obstetrics & Gynecology

## 2017-09-21 DIAGNOSIS — O409XX0 Polyhydramnios, unspecified trimester, not applicable or unspecified: Secondary | ICD-10-CM | POA: Insufficient documentation

## 2017-09-21 DIAGNOSIS — O403XX0 Polyhydramnios, third trimester, not applicable or unspecified: Secondary | ICD-10-CM

## 2017-09-27 ENCOUNTER — Encounter (HOSPITAL_COMMUNITY): Payer: Self-pay

## 2017-09-27 ENCOUNTER — Other Ambulatory Visit (HOSPITAL_COMMUNITY): Payer: Self-pay | Admitting: Obstetrics and Gynecology

## 2017-09-27 ENCOUNTER — Ambulatory Visit (HOSPITAL_COMMUNITY)
Admission: RE | Admit: 2017-09-27 | Discharge: 2017-09-27 | Disposition: A | Discharge status: Home / Self Care | Payer: BLUE CROSS/BLUE SHIELD | Source: Ambulatory Visit | Attending: Obstetrics & Gynecology | Admitting: Obstetrics & Gynecology

## 2017-09-27 DIAGNOSIS — O34219 Maternal care for unspecified type scar from previous cesarean delivery: Secondary | ICD-10-CM | POA: Diagnosis not present

## 2017-09-27 DIAGNOSIS — Z98891 History of uterine scar from previous surgery: Secondary | ICD-10-CM

## 2017-09-27 DIAGNOSIS — O409XX0 Polyhydramnios, unspecified trimester, not applicable or unspecified: Secondary | ICD-10-CM

## 2017-09-27 DIAGNOSIS — O403XX0 Polyhydramnios, third trimester, not applicable or unspecified: Secondary | ICD-10-CM | POA: Diagnosis not present

## 2017-09-27 DIAGNOSIS — Z3A34 34 weeks gestation of pregnancy: Secondary | ICD-10-CM | POA: Diagnosis not present

## 2017-09-28 ENCOUNTER — Other Ambulatory Visit (HOSPITAL_COMMUNITY): Payer: Self-pay | Admitting: *Deleted

## 2017-09-28 ENCOUNTER — Encounter: Payer: BLUE CROSS/BLUE SHIELD | Admitting: Obstetrics & Gynecology

## 2017-09-28 DIAGNOSIS — O403XX0 Polyhydramnios, third trimester, not applicable or unspecified: Secondary | ICD-10-CM

## 2017-10-04 ENCOUNTER — Encounter (HOSPITAL_COMMUNITY): Payer: Self-pay

## 2017-10-04 ENCOUNTER — Ambulatory Visit (HOSPITAL_COMMUNITY)
Admission: RE | Admit: 2017-10-04 | Discharge: 2017-10-04 | Disposition: A | Discharge status: Home / Self Care | Payer: BLUE CROSS/BLUE SHIELD | Source: Ambulatory Visit | Attending: Obstetrics & Gynecology | Admitting: Obstetrics & Gynecology

## 2017-10-04 DIAGNOSIS — O409XX0 Polyhydramnios, unspecified trimester, not applicable or unspecified: Secondary | ICD-10-CM

## 2017-10-04 DIAGNOSIS — O403XX0 Polyhydramnios, third trimester, not applicable or unspecified: Secondary | ICD-10-CM | POA: Insufficient documentation

## 2017-10-04 DIAGNOSIS — Z3A35 35 weeks gestation of pregnancy: Secondary | ICD-10-CM | POA: Diagnosis not present

## 2017-10-04 DIAGNOSIS — O34219 Maternal care for unspecified type scar from previous cesarean delivery: Secondary | ICD-10-CM | POA: Diagnosis not present

## 2017-10-11 ENCOUNTER — Encounter (HOSPITAL_COMMUNITY): Payer: Self-pay

## 2017-10-11 ENCOUNTER — Other Ambulatory Visit (HOSPITAL_COMMUNITY): Payer: Self-pay | Admitting: Obstetrics and Gynecology

## 2017-10-11 ENCOUNTER — Ambulatory Visit (HOSPITAL_COMMUNITY)
Admission: RE | Admit: 2017-10-11 | Discharge: 2017-10-11 | Disposition: A | Discharge status: Home / Self Care | Payer: BLUE CROSS/BLUE SHIELD | Source: Ambulatory Visit | Attending: Obstetrics & Gynecology | Admitting: Obstetrics & Gynecology

## 2017-10-11 ENCOUNTER — Other Ambulatory Visit (HOSPITAL_COMMUNITY): Payer: Self-pay | Admitting: Maternal and Fetal Medicine

## 2017-10-11 ENCOUNTER — Telehealth: Payer: Self-pay

## 2017-10-11 DIAGNOSIS — Z3A36 36 weeks gestation of pregnancy: Secondary | ICD-10-CM | POA: Insufficient documentation

## 2017-10-11 DIAGNOSIS — O34219 Maternal care for unspecified type scar from previous cesarean delivery: Secondary | ICD-10-CM | POA: Diagnosis not present

## 2017-10-11 DIAGNOSIS — O409XX0 Polyhydramnios, unspecified trimester, not applicable or unspecified: Secondary | ICD-10-CM

## 2017-10-11 DIAGNOSIS — O403XX0 Polyhydramnios, third trimester, not applicable or unspecified: Secondary | ICD-10-CM | POA: Diagnosis not present

## 2017-10-11 NOTE — Telephone Encounter (Signed)
Dr.Ervin sent message for pt to be seen here for OB appt this week. I called pt and left a message letting her know what Dr.Ervin said and I provided office phone number for her to call and schedule.

## 2017-10-13 ENCOUNTER — Telehealth (HOSPITAL_COMMUNITY): Payer: Self-pay | Admitting: *Deleted

## 2017-10-13 NOTE — Telephone Encounter (Signed)
Preadmission screen  

## 2017-10-14 ENCOUNTER — Telehealth (HOSPITAL_COMMUNITY): Payer: Self-pay | Admitting: *Deleted

## 2017-10-14 ENCOUNTER — Ambulatory Visit (INDEPENDENT_AMBULATORY_CARE_PROVIDER_SITE_OTHER): Payer: BLUE CROSS/BLUE SHIELD | Admitting: Obstetrics & Gynecology

## 2017-10-14 VITALS — BP 98/64 | HR 89 | Wt 186.0 lb

## 2017-10-14 DIAGNOSIS — Z3493 Encounter for supervision of normal pregnancy, unspecified, third trimester: Secondary | ICD-10-CM

## 2017-10-14 DIAGNOSIS — Z3483 Encounter for supervision of other normal pregnancy, third trimester: Secondary | ICD-10-CM

## 2017-10-14 DIAGNOSIS — Z113 Encounter for screening for infections with a predominantly sexual mode of transmission: Secondary | ICD-10-CM | POA: Diagnosis not present

## 2017-10-14 DIAGNOSIS — O409XX0 Polyhydramnios, unspecified trimester, not applicable or unspecified: Secondary | ICD-10-CM

## 2017-10-14 LAB — POCT URINALYSIS DIPSTICK OB
GLUCOSE, UA: NEGATIVE
POC,PROTEIN,UA: NEGATIVE

## 2017-10-14 NOTE — Telephone Encounter (Signed)
Preadmission screen  

## 2017-10-14 NOTE — Progress Notes (Signed)
   PRENATAL VISIT NOTE  Subjective:  Savannah Wade is a 27 y.o. G3P1011 at 4267w0d being seen today for ongoing prenatal care.  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  Patient reports no complaints.  Contractions: Not present. Vag. Bleeding: None.  Movement: Present. Denies leaking of fluid.   The following portions of the patient's history were reviewed and updated as appropriate: allergies, current medications, past family history, past medical history, past social history, past surgical history and problem list. Problem list updated.  Objective:   Vitals:   10/14/17 1547  BP: 98/64  Pulse: 89  Weight: 186 lb (84.4 kg)    Fetal Status: Fetal Heart Rate (bpm): 127   Movement: Present     General:  Alert, oriented and cooperative. Patient is in no acute distress.  Skin: Skin is warm and dry. No rash noted.   Cardiovascular: Normal heart rate noted  Respiratory: Normal respiratory effort, no problems with respiration noted  Abdomen: Soft, gravid, appropriate for gestational age.  Pain/Pressure: Absent     Pelvic: Cervical exam deferred        Extremities: Normal range of motion.  Edema: None  Mental Status: Normal mood and affect. Normal behavior. Normal judgment and thought content.   Assessment and Plan:  Pregnancy: G3P1011 at 7567w0d  1. Encounter for supervision of other normal pregnancy in third trimester - Culture, beta strep (group b only) - GC[facility]Chlamydia probe amp [facility] - POC Urinalysis Dipstick OB  2. Polyhydramnios affecting pregnancy - Continue weekly BPPs with MFM  Term labor symptoms  and general obstetric precautions including but not limited to vaginal bleeding, contractions, leaking of fluid and fetal movement were reviewed in detail with the patient. Please refer to After Visit Summary for other counseling recommendations.  No follow-ups on file.  Future Appointments  Date Time Provider Department Center  10/19/2017  3:15 PM [facility] US 4 [facility]  10/21/2017  3:15 PM Allie Bossierove, Myra C, [facility]  10/27/2017  9:45 AM [facility] PAT 5 [facility] None    Elsie LincolnKelly Vivyan Biggers, MD

## 2017-10-15 ENCOUNTER — Encounter (HOSPITAL_COMMUNITY): Payer: Self-pay

## 2017-10-15 LAB — GC/CHLAMYDIA PROBE AMP (~~LOC~~) NOT AT ARMC
CHLAMYDIA, DNA PROBE: NEGATIVE
NEISSERIA GONORRHEA: NEGATIVE

## 2017-10-16 LAB — CULTURE, BETA STREP (GROUP B ONLY)
MICRO NUMBER:: 91035884
SPECIMEN QUALITY:: ADEQUATE

## 2017-10-19 ENCOUNTER — Ambulatory Visit (HOSPITAL_COMMUNITY)
Admission: RE | Admit: 2017-10-19 | Discharge: 2017-10-19 | Disposition: A | Discharge status: Home / Self Care | Payer: BLUE CROSS/BLUE SHIELD | Source: Ambulatory Visit | Attending: Obstetrics & Gynecology | Admitting: Obstetrics & Gynecology

## 2017-10-19 ENCOUNTER — Encounter (HOSPITAL_COMMUNITY): Payer: Self-pay

## 2017-10-19 DIAGNOSIS — Z3A37 37 weeks gestation of pregnancy: Secondary | ICD-10-CM | POA: Diagnosis not present

## 2017-10-19 DIAGNOSIS — O403XX0 Polyhydramnios, third trimester, not applicable or unspecified: Secondary | ICD-10-CM

## 2017-10-19 DIAGNOSIS — O409XX0 Polyhydramnios, unspecified trimester, not applicable or unspecified: Secondary | ICD-10-CM

## 2017-10-19 DIAGNOSIS — O34219 Maternal care for unspecified type scar from previous cesarean delivery: Secondary | ICD-10-CM | POA: Diagnosis not present

## 2017-10-20 ENCOUNTER — Other Ambulatory Visit (HOSPITAL_COMMUNITY): Payer: Self-pay | Admitting: *Deleted

## 2017-10-20 DIAGNOSIS — O409XX0 Polyhydramnios, unspecified trimester, not applicable or unspecified: Secondary | ICD-10-CM

## 2017-10-21 ENCOUNTER — Ambulatory Visit (INDEPENDENT_AMBULATORY_CARE_PROVIDER_SITE_OTHER): Payer: BLUE CROSS/BLUE SHIELD | Admitting: Obstetrics & Gynecology

## 2017-10-21 VITALS — BP 107/71 | HR 87 | Wt 186.0 lb

## 2017-10-21 DIAGNOSIS — O409XX0 Polyhydramnios, unspecified trimester, not applicable or unspecified: Secondary | ICD-10-CM

## 2017-10-21 DIAGNOSIS — Z3493 Encounter for supervision of normal pregnancy, unspecified, third trimester: Secondary | ICD-10-CM

## 2017-10-21 NOTE — Progress Notes (Signed)
   PRENATAL VISIT NOTE  Subjective:  Savannah Wade is a 27 y.o. G3P1011 at [redacted]w[redacted]d being seen today for ongoing prenatal care.  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  Patient reports no complaints.  Contractions: Not present. Vag. Bleeding: None.  Movement: Present. Denies leaking of fluid.   The following portions of the patient's history were reviewed and updated as appropriate: allergies, current medications, past family history, past medical history, past social history, past surgical history and problem list. Problem list updated.  Objective:   Vitals:   10/21/17 1519  BP: 107/71  Pulse: 87  Weight: 186 lb (84.4 kg)    Fetal Status:     Movement: Present     General:  Alert, oriented and cooperative. Patient is in no acute distress.  Skin: Skin is warm and dry. No rash noted.   Cardiovascular: Normal heart rate noted  Respiratory: Normal respiratory effort, no problems with respiration noted  Abdomen: Soft, gravid, appropriate for gestational age.  Pain/Pressure: Present     Pelvic: Cervical exam deferred        Extremities: Normal range of motion.  Edema: None  Mental Status: Normal mood and affect. Normal behavior. Normal judgment and thought content.   Assessment and Plan:  Pregnancy: G3P1011 at [redacted]w[redacted]d  1. Encounter for supervision of low-risk pregnancy in third trimester - RLTCS scheduled for next week  2. Polyhydramnios affecting pregnancy - BPP next week (2 days prior to delivery)  Preterm labor symptoms and general obstetric precautions including but not limited to vaginal bleeding, contractions, leaking  of fluid and fetal movement were reviewed in detail with the patient. Please refer to After Visit Summary for other counseling recommendations.  No follow-ups on file.  Future Appointments  Date Time Provider Department Center  10/26/2017  4:00 PM [facility] [place] 3 [facility]-US  10/27/2017  9:45 AM [facility] PAT 5 [facility] None    Allie Bossier, MD

## 2017-10-26 ENCOUNTER — Encounter (HOSPITAL_COMMUNITY): Payer: Self-pay | Admitting: *Deleted

## 2017-10-26 ENCOUNTER — Encounter (HOSPITAL_COMMUNITY)
Admission: RE | Admit: 2017-10-26 | Discharge: 2017-10-26 | Disposition: A | Discharge status: Home / Self Care | Payer: BLUE CROSS/BLUE SHIELD | Source: Ambulatory Visit | Attending: Obstetrics and Gynecology | Admitting: Obstetrics and Gynecology

## 2017-10-26 ENCOUNTER — Inpatient Hospital Stay (HOSPITAL_COMMUNITY)
Admission: AD | Admit: 2017-10-26 | Discharge: 2017-10-26 | Disposition: A | Discharge status: Home / Self Care | Payer: BLUE CROSS/BLUE SHIELD | Source: Ambulatory Visit | Attending: Family Medicine | Admitting: Family Medicine

## 2017-10-26 ENCOUNTER — Encounter (HOSPITAL_COMMUNITY): Payer: Self-pay

## 2017-10-26 ENCOUNTER — Ambulatory Visit (HOSPITAL_BASED_OUTPATIENT_CLINIC_OR_DEPARTMENT_OTHER)
Admission: RE | Admit: 2017-10-26 | Discharge: 2017-10-26 | Disposition: A | Discharge status: Home / Self Care | Payer: BLUE CROSS/BLUE SHIELD | Source: Ambulatory Visit | Attending: Obstetrics & Gynecology | Admitting: Obstetrics & Gynecology

## 2017-10-26 ENCOUNTER — Other Ambulatory Visit: Payer: Self-pay

## 2017-10-26 DIAGNOSIS — O403XX0 Polyhydramnios, third trimester, not applicable or unspecified: Secondary | ICD-10-CM

## 2017-10-26 DIAGNOSIS — O409XX0 Polyhydramnios, unspecified trimester, not applicable or unspecified: Secondary | ICD-10-CM

## 2017-10-26 DIAGNOSIS — Z3A38 38 weeks gestation of pregnancy: Secondary | ICD-10-CM | POA: Diagnosis not present

## 2017-10-26 LAB — CBC
HCT: 36.6 % (ref 36.0–46.0)
Hemoglobin: 12 g/dL (ref 12.0–15.0)
MCH: 28.3 pg (ref 26.0–34.0)
MCHC: 32.8 g/dL (ref 30.0–36.0)
MCV: 86.3 fL (ref 78.0–100.0)
PLATELETS: 197 10*3/uL (ref 150–400)
RBC: 4.24 MIL/uL (ref 3.87–5.11)
RDW: 14.9 % (ref 11.5–15.5)
WBC: 7.4 10*3/uL (ref 4.0–10.5)

## 2017-10-26 LAB — TYPE AND SCREEN
ABO/RH(D): A POS
ANTIBODY SCREEN: NEGATIVE

## 2017-10-26 NOTE — ED Notes (Signed)
Pt to MAU for further monitoring.  Report called to [facility]

## 2017-10-26 NOTE — Discharge Instructions (Signed)

## 2017-10-26 NOTE — MAU Note (Signed)
Pt sent from MFM for EFM secondary BPP 6/8 & polyhydramnios.

## 2017-10-26 NOTE — MAU Provider Note (Signed)
History     CSN: [id number]  Arrival date and time: 10/26/17 1700   None     Chief Complaint  Patient presents with  . EFM   HPI  Savannah Wade is a 27 yo feamle IUP 38 5/7 weeks with poly. Seen in MFM today for antenatal testing. BPP 6/8 ( - 2 for breathing)  Sent to MAU for further monitoring Pt reports normal FM. Denies VB or LOF Prenatal care at [facility] and has only been remarkable for prior c section , desire for repeat and poly.  Past Medical History:  Diagnosis Date  . Vaginal Pap smear, abnormal     Past Surgical History:  Procedure Laterality Date  . CESAREAN SECTION N/A 09/09/2015   Procedure: CESAREAN SECTION;  Surgeon: Tilda Burrow, MD;  Location: [facility] BIRTHING SUITES;  Service: Obstetrics;  Laterality: N/A;  . WISDOM TOOTH EXTRACTION      Family History  Problem Relation Age of Onset  . Cancer Father        testicular  . Diabetes Paternal Grandmother   . Heart attack Paternal Grandmother        bypass surgery  . Hypertension Paternal Grandmother   . Cancer - Colon Paternal Grandfather        colon  . Cancer Paternal Grandfather        prostate  . COPD Paternal Grandfather   . Heart attack Maternal Grandfather 50  . Hyperlipidemia Maternal Grandfather   . Heart attack Maternal Grandmother        x 2  . Coronary artery disease Maternal Grandmother     Social History   Tobacco Use  . Smoking status: Former Smoker    Years: 2.00    Types: Cigarettes  . Smokeless tobacco: Never Used  Substance Use Topics  . Alcohol use: Yes    Alcohol/week: 0.0 standard drinks    Comment: occasionally  . Drug use: No    Allergies:  Allergies  Allergen Reactions  . Sulfa Antibiotics Hives    Medications Prior to Admission  Medication Sig Dispense Refill Last Dose  . acetaminophen (TYLENOL) 500 MG tablet Take 500 mg by mouth daily as needed for mild pain.    Taking  . calcium carbonate (TUMS - DOSED IN MG ELEMENTAL CALCIUM) 500 MG chewable tablet Chew 2  tablets by mouth daily as needed for indigestion or heartburn.    Taking  . Prenatal Multivit-Min-Fe-FA (PRENATAL VITAMINS PO) Take 1 tablet by mouth daily.    Taking  . valACYclovir (VALTREX) 1000 MG tablet Take 1,000 mg by mouth daily.   12 Taking    Review of Systems  Constitutional: Negative.   Respiratory: Negative.   Cardiovascular: Negative.   Gastrointestinal: Negative.   Genitourinary: Negative.    Physical Exam   Temperature 97.9 F (36.6 C), temperature source Oral, resp. rate 18, height 5\' 2"  (1.575 m), weight 85 kg, last menstrual period 11/25/2016, unknown if currently breastfeeding.  Physical Exam  Constitutional: She appears well-developed and well-nourished.  Cardiovascular: Normal heart sounds.  Respiratory: Effort normal and breath sounds normal.  GI: Soft. Bowel sounds are normal.  gravid    MAU Course  Procedures FHT's  120-130's, + accels noted  MDM Pt with reactive NST  Assessment and Plan  IUP 38 5/7 weeks Polyhydramnios  Pt with reactive NST, giving BPP 8/10 Will discharge home. Labor precautions and fetal kick counts To return on Thursday for repeat c section.    Savannah Wade 10/26/2017, 8:00  PM  

## 2017-10-26 NOTE — Patient Instructions (Signed)
Savannah Wade  10/26/2017   Your procedure is scheduled on:  10/28/2017  Enter through the Main Entrance of [facility] at 0800 AM.  Pick up the phone at the desk and dial [phone]  Call this number if you have problems the morning of surgery:[phone]  Remember:   Do not eat food:(After Midnight) Desps de medianoche.  Do not drink clear liquids: (After Midnight) Desps de medianoche.  Take these medicines the morning of surgery with A SIP OF WATER: none   Do not wear jewelry, make-up or nail polish.  Do not wear lotions, powders, or perfumes. Do not wear deodorant.  Do not shave 48 hours prior to surgery.  Do not bring valuables to the hospital.  [facility] is not   responsible for any belongings or valuables brought to the hospital.  Contacts, dentures or bridgework may not be worn into surgery.  Leave suitcase in the car. After surgery it may be brought to your room.  For patients admitted to the hospital, checkout time is 11:00 AM the day of              discharge.    N/A   Please read over the following fact sheets that you were given:   Surgical Site Infection Prevention

## 2017-10-27 ENCOUNTER — Encounter (HOSPITAL_COMMUNITY)
Admission: RE | Admit: 2017-10-27 | Discharge: 2017-10-27 | Disposition: A | Discharge status: Home / Self Care | Payer: BLUE CROSS/BLUE SHIELD | Source: Ambulatory Visit | Attending: Obstetrics and Gynecology | Admitting: Obstetrics and Gynecology

## 2017-10-27 ENCOUNTER — Encounter (HOSPITAL_COMMUNITY): Payer: Self-pay

## 2017-10-27 LAB — RPR: RPR Ser Ql: NONREACTIVE

## 2017-10-28 ENCOUNTER — Inpatient Hospital Stay (HOSPITAL_COMMUNITY): Payer: BLUE CROSS/BLUE SHIELD

## 2017-10-28 ENCOUNTER — Encounter (HOSPITAL_COMMUNITY): Payer: Self-pay | Admitting: *Deleted

## 2017-10-28 ENCOUNTER — Other Ambulatory Visit: Payer: Self-pay

## 2017-10-28 ENCOUNTER — Encounter (HOSPITAL_COMMUNITY)
Admission: AD | Disposition: A | Discharge status: Home / Self Care | Payer: Self-pay | Source: Home / Self Care | Attending: Obstetrics and Gynecology

## 2017-10-28 ENCOUNTER — Inpatient Hospital Stay (HOSPITAL_COMMUNITY)
Admission: AD | Admit: 2017-10-28 | Discharge: 2017-10-31 | DRG: 787 | Disposition: A | Discharge status: Home / Self Care | Payer: BLUE CROSS/BLUE SHIELD | Attending: Obstetrics and Gynecology | Admitting: Obstetrics and Gynecology

## 2017-10-28 DIAGNOSIS — R634 Abnormal weight loss: Secondary | ICD-10-CM | POA: Diagnosis not present

## 2017-10-28 DIAGNOSIS — Z3A39 39 weeks gestation of pregnancy: Secondary | ICD-10-CM | POA: Diagnosis not present

## 2017-10-28 DIAGNOSIS — Z23 Encounter for immunization: Secondary | ICD-10-CM | POA: Diagnosis not present

## 2017-10-28 DIAGNOSIS — Z831 Family history of other infectious and parasitic diseases: Secondary | ICD-10-CM | POA: Diagnosis not present

## 2017-10-28 DIAGNOSIS — O9832 Other infections with a predominantly sexual mode of transmission complicating childbirth: Secondary | ICD-10-CM | POA: Diagnosis present

## 2017-10-28 DIAGNOSIS — O99824 Streptococcus B carrier state complicating childbirth: Secondary | ICD-10-CM | POA: Diagnosis not present

## 2017-10-28 DIAGNOSIS — O403XX0 Polyhydramnios, third trimester, not applicable or unspecified: Secondary | ICD-10-CM | POA: Diagnosis not present

## 2017-10-28 DIAGNOSIS — Z98891 History of uterine scar from previous surgery: Secondary | ICD-10-CM

## 2017-10-28 DIAGNOSIS — O34211 Maternal care for low transverse scar from previous cesarean delivery: Secondary | ICD-10-CM | POA: Diagnosis not present

## 2017-10-28 DIAGNOSIS — Z87891 Personal history of nicotine dependence: Secondary | ICD-10-CM

## 2017-10-28 DIAGNOSIS — A6000 Herpesviral infection of urogenital system, unspecified: Secondary | ICD-10-CM | POA: Diagnosis not present

## 2017-10-28 DIAGNOSIS — O339 Maternal care for disproportion, unspecified: Secondary | ICD-10-CM | POA: Diagnosis present

## 2017-10-28 DIAGNOSIS — Q828 Other specified congenital malformations of skin: Secondary | ICD-10-CM | POA: Diagnosis not present

## 2017-10-28 SURGERY — Surgical Case
Anesthesia: Spinal | Site: Abdomen | Wound class: Clean Contaminated

## 2017-10-28 MED ORDER — NALOXONE HCL 4 MG/10ML IJ SOLN: 1.0000 ug/kg/h | INTRAVENOUS | Status: DC | PRN

## 2017-10-28 MED ORDER — SIMETHICONE 80 MG PO CHEW
80.0000 mg | CHEWABLE_TABLET | Freq: Three times a day (TID) | ORAL | Status: DC
  Administered 2017-10-28 – 2017-10-31 (×7): 80 mg via ORAL
  Filled 2017-10-28 (×7): qty 1

## 2017-10-28 MED ORDER — NALOXONE HCL 0.4 MG/ML IJ SOLN: 0.4000 mg | INTRAMUSCULAR | Status: DC | PRN

## 2017-10-28 MED ORDER — SCOPOLAMINE 1 MG/3DAYS TD PT72
MEDICATED_PATCH | TRANSDERMAL | Status: AC
  Filled 2017-10-28: qty 1

## 2017-10-28 MED ORDER — NALBUPHINE HCL 10 MG/ML IJ SOLN: 5.0000 mg | INTRAMUSCULAR | Status: DC | PRN

## 2017-10-28 MED ORDER — BUPIVACAINE IN DEXTROSE 0.75-8.25 % IT SOLN
INTRATHECAL | Status: DC | PRN
  Administered 2017-10-28: 1.6 mL via INTRATHECAL

## 2017-10-28 MED ORDER — MORPHINE SULFATE (PF) 0.5 MG/ML IJ SOLN
INTRAMUSCULAR | Status: AC
  Filled 2017-10-28: qty 10

## 2017-10-28 MED ORDER — DEXAMETHASONE SODIUM PHOSPHATE 4 MG/ML IJ SOLN
INTRAMUSCULAR | Status: AC
  Filled 2017-10-28: qty 1

## 2017-10-28 MED ORDER — FENTANYL CITRATE (PF) 100 MCG/2ML IJ SOLN
INTRAMUSCULAR | Status: DC | PRN
  Administered 2017-10-28: 15 ug via INTRAVENOUS

## 2017-10-28 MED ORDER — ONDANSETRON HCL 4 MG/2ML IJ SOLN
INTRAMUSCULAR | Status: DC | PRN
  Administered 2017-10-28: 4 mg via INTRAVENOUS

## 2017-10-28 MED ORDER — FENTANYL CITRATE (PF) 100 MCG/2ML IJ SOLN
INTRAMUSCULAR | Status: AC
  Filled 2017-10-28: qty 2

## 2017-10-28 MED ORDER — ACETAMINOPHEN 325 MG PO TABS
650.0000 mg | ORAL_TABLET | ORAL | Status: DC | PRN
  Administered 2017-10-29: 650 mg via ORAL
  Filled 2017-10-28: qty 2

## 2017-10-28 MED ORDER — OXYCODONE HCL 5 MG PO TABS: 10.0000 mg | ORAL_TABLET | ORAL | Status: DC | PRN

## 2017-10-28 MED ORDER — NALBUPHINE HCL 10 MG/ML IJ SOLN: 5.0000 mg | Freq: Once | INTRAMUSCULAR | Status: DC | PRN

## 2017-10-28 MED ORDER — OXYTOCIN 10 UNIT/ML IJ SOLN
INTRAMUSCULAR | Status: AC
  Filled 2017-10-28: qty 1

## 2017-10-28 MED ORDER — MEPERIDINE HCL 25 MG/ML IJ SOLN: 6.2500 mg | INTRAMUSCULAR | Status: DC | PRN

## 2017-10-28 MED ORDER — OXYCODONE HCL 5 MG PO TABS: 5.0000 mg | ORAL_TABLET | ORAL | Status: DC | PRN

## 2017-10-28 MED ORDER — SCOPOLAMINE 1 MG/3DAYS TD PT72
1.0000 | MEDICATED_PATCH | Freq: Once | TRANSDERMAL | Status: AC
  Administered 2017-10-28: 1.5 mg via TRANSDERMAL

## 2017-10-28 MED ORDER — TETANUS-DIPHTH-ACELL PERTUSSIS 5-2.5-18.5 LF-MCG/0.5 IM SUSP: 0.5000 mL | Freq: Once | INTRAMUSCULAR | Status: DC

## 2017-10-28 MED ORDER — ZOLPIDEM TARTRATE 5 MG PO TABS: 5.0000 mg | ORAL_TABLET | Freq: Every evening | ORAL | Status: DC | PRN

## 2017-10-28 MED ORDER — KETOROLAC TROMETHAMINE 30 MG/ML IJ SOLN
30.0000 mg | Freq: Four times a day (QID) | INTRAMUSCULAR | Status: AC
  Administered 2017-10-28 – 2017-10-29 (×3): 30 mg via INTRAVENOUS
  Filled 2017-10-28 (×3): qty 1

## 2017-10-28 MED ORDER — VALACYCLOVIR HCL 500 MG PO TABS
1000.0000 mg | ORAL_TABLET | Freq: Every day | ORAL | Status: DC
  Administered 2017-10-29 – 2017-10-31 (×3): 1000 mg via ORAL
  Filled 2017-10-28 (×3): qty 2

## 2017-10-28 MED ORDER — LACTATED RINGERS IV SOLN
INTRAVENOUS | Status: DC
  Administered 2017-10-28 (×3): via INTRAVENOUS

## 2017-10-28 MED ORDER — MENTHOL 3 MG MT LOZG: 1.0000 | LOZENGE | OROMUCOSAL | Status: DC | PRN

## 2017-10-28 MED ORDER — KETOROLAC TROMETHAMINE 15 MG/ML IJ SOLN
INTRAMUSCULAR | Status: DC | PRN
  Administered 2017-10-28: 30 mg via INTRAVENOUS

## 2017-10-28 MED ORDER — ONDANSETRON HCL 4 MG/2ML IJ SOLN: 4.0000 mg | Freq: Three times a day (TID) | INTRAMUSCULAR | Status: DC | PRN

## 2017-10-28 MED ORDER — LACTATED RINGERS IV SOLN
INTRAVENOUS | Status: DC
  Administered 2017-10-28: 21:00:00 via INTRAVENOUS

## 2017-10-28 MED ORDER — DIPHENHYDRAMINE HCL 50 MG/ML IJ SOLN: 12.5000 mg | INTRAMUSCULAR | Status: DC | PRN

## 2017-10-28 MED ORDER — BUPIVACAINE HCL (PF) 0.5 % IJ SOLN
INTRAMUSCULAR | Status: DC | PRN
  Administered 2017-10-28: 30 mL

## 2017-10-28 MED ORDER — ENOXAPARIN SODIUM 40 MG/0.4ML ~~LOC~~ SOLN
40.0000 mg | SUBCUTANEOUS | Status: DC
  Administered 2017-10-29 – 2017-10-31 (×3): 40 mg via SUBCUTANEOUS
  Filled 2017-10-28 (×3): qty 0.4

## 2017-10-28 MED ORDER — ONDANSETRON HCL 4 MG/2ML IJ SOLN
INTRAMUSCULAR | Status: AC
  Filled 2017-10-28: qty 2

## 2017-10-28 MED ORDER — DIPHENHYDRAMINE HCL 25 MG PO CAPS
25.0000 mg | ORAL_CAPSULE | ORAL | Status: DC | PRN
  Filled 2017-10-28: qty 1

## 2017-10-28 MED ORDER — ACETAMINOPHEN 500 MG PO TABS
1000.0000 mg | ORAL_TABLET | Freq: Four times a day (QID) | ORAL | Status: AC
  Administered 2017-10-28 – 2017-10-29 (×3): 1000 mg via ORAL
  Filled 2017-10-28 (×3): qty 2

## 2017-10-28 MED ORDER — LACTATED RINGERS IV SOLN
INTRAVENOUS | Status: DC | PRN
  Administered 2017-10-28: 11:00:00 via INTRAVENOUS

## 2017-10-28 MED ORDER — OXYTOCIN 40 UNITS IN LACTATED RINGERS INFUSION - SIMPLE MED: 2.5000 [IU]/h | INTRAVENOUS | Status: AC

## 2017-10-28 MED ORDER — PHENYLEPHRINE 8 MG IN D5W 100 ML (0.08MG/ML) PREMIX OPTIME
INJECTION | INTRAVENOUS | Status: DC | PRN
  Administered 2017-10-28: 60 ug/min via INTRAVENOUS

## 2017-10-28 MED ORDER — BUPIVACAINE HCL (PF) 0.5 % IJ SOLN
INTRAMUSCULAR | Status: AC
  Filled 2017-10-28: qty 30

## 2017-10-28 MED ORDER — PHENYLEPHRINE 8 MG IN D5W 100 ML (0.08MG/ML) PREMIX OPTIME
INJECTION | INTRAVENOUS | Status: AC
  Filled 2017-10-28: qty 100

## 2017-10-28 MED ORDER — KETOROLAC TROMETHAMINE 30 MG/ML IJ SOLN
INTRAMUSCULAR | Status: AC
  Filled 2017-10-28: qty 1

## 2017-10-28 MED ORDER — DIPHENHYDRAMINE HCL 25 MG PO CAPS: 25.0000 mg | ORAL_CAPSULE | Freq: Four times a day (QID) | ORAL | Status: DC | PRN

## 2017-10-28 MED ORDER — OXYTOCIN 10 UNIT/ML IJ SOLN
INTRAMUSCULAR | Status: AC
  Filled 2017-10-28: qty 4

## 2017-10-28 MED ORDER — DEXAMETHASONE SODIUM PHOSPHATE 10 MG/ML IJ SOLN
INTRAMUSCULAR | Status: DC | PRN
  Administered 2017-10-28: 4 mg via INTRAVENOUS

## 2017-10-28 MED ORDER — SIMETHICONE 80 MG PO CHEW: 80.0000 mg | CHEWABLE_TABLET | ORAL | Status: DC | PRN

## 2017-10-28 MED ORDER — PROMETHAZINE HCL 25 MG/ML IJ SOLN: 6.2500 mg | INTRAMUSCULAR | Status: DC | PRN

## 2017-10-28 MED ORDER — MORPHINE SULFATE (PF) 0.5 MG/ML IJ SOLN
INTRAMUSCULAR | Status: DC | PRN
  Administered 2017-10-28: .15 mg via EPIDURAL

## 2017-10-28 MED ORDER — PRENATAL MULTIVITAMIN CH
1.0000 | ORAL_TABLET | Freq: Every day | ORAL | Status: DC
  Administered 2017-10-29 – 2017-10-31 (×3): 1 via ORAL
  Filled 2017-10-28 (×3): qty 1

## 2017-10-28 MED ORDER — WITCH HAZEL-GLYCERIN EX PADS: 1.0000 "application " | MEDICATED_PAD | CUTANEOUS | Status: DC | PRN

## 2017-10-28 MED ORDER — SOD CITRATE-CITRIC ACID 500-334 MG/5ML PO SOLN
30.0000 mL | ORAL | Status: AC
  Administered 2017-10-28: 30 mL via ORAL
  Filled 2017-10-28: qty 15

## 2017-10-28 MED ORDER — KETOROLAC TROMETHAMINE 30 MG/ML IJ SOLN: 30.0000 mg | Freq: Once | INTRAMUSCULAR | Status: DC | PRN

## 2017-10-28 MED ORDER — CEFAZOLIN SODIUM-DEXTROSE 2-4 GM/100ML-% IV SOLN
2.0000 g | INTRAVENOUS | Status: AC
  Administered 2017-10-28: 2 g via INTRAVENOUS
  Filled 2017-10-28: qty 100

## 2017-10-28 MED ORDER — KETOROLAC TROMETHAMINE 30 MG/ML IJ SOLN: 30.0000 mg | Freq: Four times a day (QID) | INTRAMUSCULAR | Status: AC | PRN

## 2017-10-28 MED ORDER — SIMETHICONE 80 MG PO CHEW
80.0000 mg | CHEWABLE_TABLET | ORAL | Status: DC
  Administered 2017-10-28 – 2017-10-30 (×3): 80 mg via ORAL
  Filled 2017-10-28 (×3): qty 1

## 2017-10-28 MED ORDER — HYDROMORPHONE HCL 1 MG/ML IJ SOLN: 0.2500 mg | INTRAMUSCULAR | Status: DC | PRN

## 2017-10-28 MED ORDER — DIBUCAINE 1 % RE OINT: 1.0000 "application " | TOPICAL_OINTMENT | RECTAL | Status: DC | PRN

## 2017-10-28 MED ORDER — SODIUM CHLORIDE 0.9 % IR SOLN
Status: DC | PRN
  Administered 2017-10-28: 1

## 2017-10-28 MED ORDER — SODIUM CHLORIDE 0.9% FLUSH: 3.0000 mL | INTRAVENOUS | Status: DC | PRN

## 2017-10-28 MED ORDER — IBUPROFEN 800 MG PO TABS
800.0000 mg | ORAL_TABLET | Freq: Three times a day (TID) | ORAL | Status: DC
  Administered 2017-10-29 – 2017-10-31 (×6): 800 mg via ORAL
  Filled 2017-10-28 (×6): qty 1

## 2017-10-28 MED ORDER — SENNOSIDES-DOCUSATE SODIUM 8.6-50 MG PO TABS
2.0000 | ORAL_TABLET | ORAL | Status: DC
  Administered 2017-10-28 – 2017-10-30 (×3): 2 via ORAL
  Filled 2017-10-28 (×3): qty 2

## 2017-10-28 MED ORDER — OXYTOCIN 10 UNIT/ML IJ SOLN
INTRAVENOUS | Status: DC | PRN
  Administered 2017-10-28: 40 [IU] via INTRAVENOUS

## 2017-10-28 MED ORDER — PROMETHAZINE HCL 25 MG/ML IJ SOLN: 25.0000 mg | Freq: Once | INTRAMUSCULAR | Status: DC

## 2017-10-28 MED ORDER — COCONUT OIL OIL: 1.0000 "application " | TOPICAL_OIL | Status: DC | PRN

## 2017-10-28 MED ORDER — MEASLES, MUMPS & RUBELLA VAC ~~LOC~~ INJ
0.5000 mL | INJECTION | Freq: Once | SUBCUTANEOUS | Status: AC
  Administered 2017-10-30: 0.5 mL via SUBCUTANEOUS
  Filled 2017-10-28: qty 0.5

## 2017-10-28 SURGICAL SUPPLY — 38 items
BENZOIN TINCTURE PRP APPL 2/3 (GAUZE/BANDAGES/DRESSINGS) ×2 IMPLANT
CHLORAPREP W/TINT 26ML (MISCELLANEOUS) ×2 IMPLANT
CLAMP CORD UMBIL (MISCELLANEOUS) IMPLANT
CLOSURE STERI STRIP 1/2 X4 (GAUZE/BANDAGES/DRESSINGS) ×2 IMPLANT
CLOTH BEACON ORANGE TIMEOUT ST (SAFETY) ×2 IMPLANT
DRAPE C SECTION CLR SCREEN (DRAPES) IMPLANT
DRSG OPSITE POSTOP 4X10 (GAUZE/BANDAGES/DRESSINGS) ×2 IMPLANT
ELECT REM PT RETURN 9FT ADLT (ELECTROSURGICAL) ×2
ELECTRODE REM PT RTRN 9FT ADLT (ELECTROSURGICAL) ×1 IMPLANT
EXTRACTOR VACUUM M CUP 4 TUBE (SUCTIONS) ×2 IMPLANT
GLOVE BIO SURGEON STRL SZ7.5 (GLOVE) ×2 IMPLANT
GLOVE BIOGEL PI IND STRL 7.0 (GLOVE) ×1 IMPLANT
GLOVE BIOGEL PI INDICATOR 7.0 (GLOVE) ×1
GOWN STRL REUS W/TWL 2XL LVL3 (GOWN DISPOSABLE) ×2 IMPLANT
GOWN STRL REUS W/TWL LRG LVL3 (GOWN DISPOSABLE) ×4 IMPLANT
KIT ABG SYR 3ML LUER SLIP (SYRINGE) IMPLANT
NEEDLE HYPO 22GX1.5 SAFETY (NEEDLE) ×4 IMPLANT
NEEDLE HYPO 25X5/8 SAFETYGLIDE (NEEDLE) IMPLANT
NS IRRIG 1000ML POUR BTL (IV SOLUTION) ×2 IMPLANT
PACK C SECTION WH (CUSTOM PROCEDURE TRAY) ×2 IMPLANT
PAD OB MATERNITY 4.3X12.25 (PERSONAL CARE ITEMS) ×2 IMPLANT
PENCIL SMOKE EVAC W/HOLSTER (ELECTROSURGICAL) ×2 IMPLANT
RTRCTR C-SECT PINK 25CM LRG (MISCELLANEOUS) ×2 IMPLANT
STRIP CLOSURE SKIN 1/2X4 (GAUZE/BANDAGES/DRESSINGS) ×2 IMPLANT
SUT CHROMIC 1 CTX 36 (SUTURE) ×4 IMPLANT
SUT VIC AB 1 CT1 36 (SUTURE) ×4 IMPLANT
SUT VIC AB 2-0 CT1 (SUTURE) ×4 IMPLANT
SUT VIC AB 2-0 CT1 27 (SUTURE) ×1
SUT VIC AB 2-0 CT1 TAPERPNT 27 (SUTURE) ×1 IMPLANT
SUT VIC AB 3-0 CT1 27 (SUTURE) ×2
SUT VIC AB 3-0 CT1 TAPERPNT 27 (SUTURE) ×2 IMPLANT
SUT VIC AB 3-0 SH 27 (SUTURE)
SUT VIC AB 3-0 SH 27X BRD (SUTURE) IMPLANT
SUT VIC AB 4-0 KS 27 (SUTURE) ×2 IMPLANT
SYR BULB IRRIGATION 50ML (SYRINGE) IMPLANT
SYR CONTROL 10ML LL (SYRINGE) ×2 IMPLANT
TOWEL OR 17X24 6PK STRL BLUE (TOWEL DISPOSABLE) ×2 IMPLANT
TRAY FOLEY W/BAG SLVR 14FR LF (SET/KITS/TRAYS/PACK) ×2 IMPLANT

## 2017-10-28 NOTE — H&P (Signed)
Obstetric Preoperative History and Physical  Savannah Wade is a 27 y.o. G3P1011 with IUP at 3966w0d presenting for scheduled cesarean section.  No acute concerns.   Prenatal Course Source of Care: [facility]  with onset of care at 7 weeks Pregnancy complications or risks: Patient Active Problem List   Diagnosis Date Noted  . Polyhydramnios affecting pregnancy 09/21/2017  . Rubella non-immune status, antepartum 03/25/2017  . Supervision of low-risk pregnancy 03/24/2017  . H/O cesarean section 10/16/2015  . 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 Genital herpes affecting pregnancy in second trimester 09/02/2015  . Psoriasis 02/13/2015  . ADD (attention deficit disorder) 02/13/2015   She plans to breastfeed She desires oral progesterone-only contraceptive for postpartum contraception.   Prenatal labs and studies: ABO, Rh: --/--/A POS (09/10 1803) Antibody: NEG (09/10 1803) Rubella: 0.92 (02/06 0905) RPR: Non Reactive (09/10 1803)  HBsAg: NON-REACTIVE (02/06 0905)  HIV: NON-REACTIVE (06/12 0852)  GBS: Positive  2 hr Glucola  Normal  Genetic screening declined Anatomy US normal  Prenatal Transfer Tool  Maternal Diabetes: No Genetic Screening: Declined Maternal Ultrasounds/Referrals: Normal Fetal Ultrasounds or other Referrals:  Referred to Materal Fetal Medicine  Maternal Substance Abuse:  No Significant Maternal Medications:  Meds include: Other: Valtrex Significant Maternal Lab Results: Lab values include: Group B Strep positive  Past Medical History:  Diagnosis Date  . Vaginal Pap smear, abnormal     Past Surgical History:  Procedure Laterality Date  . CESAREAN SECTION N/A 09/09/2015   Procedure: CESAREAN SECTION;  Surgeon: Tilda BurrowJohn V Ferguson, MD;  [facility];  Service: Obstetrics;  Laterality: N/A;  . WISDOM TOOTH EXTRACTION      OB History  Gravida Para Term Preterm AB Living  3 1 1   1 1   SAB TAB Ectopic Multiple Live  Births  1       1    # Outcome Date GA Lbr Len/2nd Weight Sex Delivery Anes PTL Lv  3 Current           2 Term 09/09/15 3517w2d  3345 g F CS-LTranv   LIV     Complications: Cephalopelvic Disproportion  1 SAB             Social History   Socioeconomic History  . Marital status: Married    Spouse name: Not on file  . Number of children: Not on file  . Years of education: Not on file  . Highest education level: Not on file  Occupational History  . Occupation: Catering managerretailer  Social Needs  . Financial resource strain: Not hard at all  . Food insecurity:    Worry: Never true    Inability: Never true  . Transportation needs:    Medical: No    Non-medical: No  Tobacco Use  . Smoking status: Former Smoker    Years: 2.00    Types: Cigarettes  . Smokeless tobacco: Never Used  Substance and Sexual Activity  . Alcohol use: Yes    Alcohol/week: 0.0 standard drinks    Comment: occasionally  . Drug use: No  . Sexual activity: Yes    Partners: Male    Birth control/protection: None  Lifestyle  . Physical activity:    Days per week: 3 days    Minutes per session: 10 min  . Stress: Not at all  Relationships  . Social connections:    Talks on phone: Not on file    Gets together: Not on file    Attends  religious service: Not on file    Active member of club or organization: Not on file    Attends meetings of clubs or organizations: Not on file    Relationship status: Not on file  Other Topics Concern  . Not on file  Social History Narrative  . Not on file    Family History  Problem Relation Age of Onset  . Cancer Father        testicular  . Diabetes Paternal Grandmother   . Heart attack Paternal Grandmother        bypass surgery  . Hypertension Paternal Grandmother   . Cancer - Colon Paternal Grandfather        colon  . Cancer Paternal Grandfather        prostate  . COPD Paternal Grandfather   . Heart attack Maternal Grandfather 50  . Hyperlipidemia Maternal Grandfather    . Heart attack Maternal Grandmother        x 2  . Coronary artery disease Maternal Grandmother     Medications Prior to Admission  Medication Sig Dispense Refill Last Dose  . calcium carbonate (TUMS - DOSED IN MG ELEMENTAL CALCIUM) 500 MG chewable tablet Chew 2 tablets by mouth daily as needed for indigestion or heartburn.    Past Week at Unknown time  . Prenatal Multivit-Min-Fe-FA (PRENATAL VITAMINS PO) Take 1 tablet by mouth daily.    10/27/2017 at Unknown time  . valACYclovir (VALTREX) 1000 MG tablet Take 1,000 mg by mouth daily.   12 Past Week at Unknown time  . acetaminophen (TYLENOL) 500 MG tablet Take 500 mg by mouth daily as needed for mild pain.    Unknown at Unknown time    Allergies  Allergen Reactions  . Sulfa Antibiotics Hives    Review of Systems: Negative except for what is mentioned in HPI.  Physical Exam: BP 107/66 (BP Location: Right Arm)   Pulse 75   Temp 98.5 F (36.9 C) (Oral)   Resp 20   Ht 5\' 2"  (1.575 m)   Wt 84.6 kg   LMP 11/25/2016   BMI 34.13 kg/m  FHR by Doppler: 125 bpm CONSTITUTIONAL: Well-developed, well-nourished female in no acute distress.  HENT:  Normocephalic, atraumatic. Oropharynx is clear and moist EYES: Conjunctivae and EOM are normal. No scleral icterus.  NECK: Normal range of motion, supple SKIN: Skin is warm and dry. No rash noted. Not diaphoretic. No erythema. NEUROLGIC: Alert and oriented to person, place, and time. Normal reflexes, muscle tone coordination. No cranial nerve deficit noted. PSYCHIATRIC: Normal mood and affect. Normal behavior. CARDIOVASCULAR: Normal heart rate noted, regular rhythm RESPIRATORY: Effort and breath sounds normal, no problems with respiration noted ABDOMEN: Soft, nontender, nondistended, gravid. Well-healed Pfannenstiel incision. PELVIC: Deferred MUSCULOSKELETAL: Normal range of motion. No edema and no tenderness. 2+ distal pulses.   Pertinent Labs/Studies:   Results for orders placed or  performed during the hospital encounter of 10/26/17 (from the past 72 hour(s))  Type and screen [facility]     Status: None   Collection Time: 10/26/17  6:03 PM  Result Value Ref Range   ABO/RH(D) A POS    Antibody Screen NEG    Sample Expiration      10/29/2017 Performed at [facility], [address] [place], [place] [address]   RPR     Status: None   Collection Time: 10/26/17  6:03 PM  Result Value Ref Range   RPR Ser Ql Non Reactive Non Reactive  Comment: (NOTE) Performed At: [facility] [address] [place], [place] [id number] Jolene Schimke MD WU:[id number]   CBC     Status: None   Collection Time: 10/26/17  6:03 PM  Result Value Ref Range   WBC 7.4 4.0 - 10.5 K/uL   RBC 4.24 3.87 - 5.11 MIL/uL   Hemoglobin 12.0 12.0 - 15.0 g/dL   HCT 95.6 21.3 - 08.6 %   MCV 86.3 78.0 - 100.0 fL   MCH 28.3 26.0 - 34.0 pg   MCHC 32.8 30.0 - 36.0 g/dL   RDW 57.8 46.9 - 62.9 %   Platelets 197 150 - 400 K/uL    Comment: Performed at [facility], [address] [place], [place] [address]    Assessment and Plan :Savannah Wade is a 27 y.o. G3P1011 at [redacted]w[redacted]d being admitted for scheduled cesarean section. The risks of cesarean section discussed with the patient included but were not limited to: bleeding which may require transfusion or reoperation; infection which may require antibiotics; injury to bowel, bladder, ureters or other surrounding organs; injury to the fetus; need for additional procedures including hysterectomy in the event of a life-threatening hemorrhage; placental abnormalities wth subsequent pregnancies, incisional problems, thromboembolic phenomenon and other postoperative/anesthesia complications. The patient concurred with the proposed plan, giving informed written consent for the procedure. Patient has been NPO since last night she will remain NPO for procedure. Anesthesia and OR aware. Preoperative prophylactic antibiotics and SCDs  ordered on call to the OR. To OR when ready.    Marcy Siren, D.O. OB Fellow  10/28/2017, 9:14 AM

## 2017-10-28 NOTE — Transfer of Care (Signed)
Immediate Anesthesia Transfer of Care Note  Patient: Savannah Wade  Procedure(s) Performed: REPEAT CESAREAN SECTION (N/A Abdomen)  Patient Location: PACU  Anesthesia Type:Spinal  Level of Consciousness: awake and alert   Airway & Oxygen Therapy: Patient Spontanous Breathing  Post-op Assessment: Report given to RN  Post vital signs: Reviewed  Last Vitals:  Vitals Value Taken Time  BP    Temp    Pulse    Resp    SpO2      Last Pain:  Vitals:   10/28/17 0847  TempSrc: Oral  PainSc: 0-No pain      Patients Stated Pain Goal: 7 (10/28/17 0847)  Complications: No apparent anesthesia complications

## 2017-10-28 NOTE — Anesthesia Procedure Notes (Signed)
Spinal  Patient location during procedure: OR Start time: 10/28/2017 10:00 AM End time: 10/28/2017 10:02 AM Staffing Anesthesiologist: Leilani AbleHatchett, Karsyn Jamie, MD Performed: anesthesiologist  Preanesthetic Checklist Completed: patient identified, site marked, surgical consent, pre-op evaluation, timeout performed, IV checked, risks and benefits discussed and monitors and equipment checked Spinal Block Patient position: sitting Prep: site prepped and draped and DuraPrep Patient monitoring: continuous pulse ox and blood pressure Approach: midline Location: L3-4 Injection technique: single-shot Needle Needle type: Pencan  Needle gauge: 24 G Needle length: 10 cm Needle insertion depth: 6 cm Assessment Sensory level: T4

## 2017-10-28 NOTE — Anesthesia Postprocedure Evaluation (Signed)
Anesthesia Post Note  Patient: Savannah Wade  Procedure(s) Performed: REPEAT CESAREAN SECTION (N/A Abdomen)     Patient location during evaluation: PACU Anesthesia Type: Spinal Level of consciousness: awake Pain management: pain level controlled Vital Signs Assessment: post-procedure vital signs reviewed and stable Respiratory status: spontaneous breathing Postop Assessment: no headache, no backache, spinal receding, patient able to bend at knees and no apparent nausea or vomiting Anesthetic complications: no    Last Vitals:  Vitals:   10/28/17 1212 10/28/17 1213  BP:    Pulse: 73 77  Resp: 15 17  Temp:    SpO2: 98% 96%    Last Pain:  Vitals:   10/28/17 1128  TempSrc:   PainSc: 0-No pain   Pain Goal: Patients Stated Pain Goal: 7 (10/28/17 0847)               Mickael Mcnutt JR,JOHN Susann GivensFRANKLIN

## 2017-10-28 NOTE — Addendum Note (Signed)
Addendum  created 10/28/17 1531 by Dali Kraner A, CRNA   Sign clinical note    

## 2017-10-28 NOTE — Op Note (Signed)
Cesarean Section Operative Report  PATIENT: Savannah Wade  PROCEDURE DATE: 10/28/2017  PREOPERATIVE DIAGNOSES: Intrauterine pregnancy at [redacted]w[redacted]d weeks gestation; patient declines vag del attempt, Polyhydramnios   POSTOPERATIVE DIAGNOSES: The same  PROCEDURE: Repeat Low Transverse Cesarean Section  SURGEON:   Surgeon(s) and Role:    * Hermina Staggers, MD - Primary    * Arvilla Market, DO - Assisting - OB Fellow    INDICATIONS: Savannah Wade is a 27 y.o. [id number] at [redacted]w[redacted]d here for cesarean section secondary to the indications listed under preoperative diagnoses; please see preoperative note for further details.  The risks of cesarean section were discussed with the patient including but were not limited to: bleeding which may require transfusion or reoperation; infection which may require antibiotics; injury to bowel, bladder, ureters or other surrounding organs; injury to the fetus; need for additional procedures including hysterectomy in the event of a life-threatening hemorrhage; placental abnormalities wth subsequent pregnancies, incisional problems, thromboembolic phenomenon and other postoperative/anesthesia complications.   The patient concurred with the proposed plan, giving informed written consent for the procedure.    FINDINGS:  Viable female infant in cephalic presentation.  Apgars 7 and 9.  Clear amniotic fluid.  Intact placenta, three vessel cord.  Normal uterus, fallopian tubes and ovaries bilaterally.  ANESTHESIA: Spinal INTRAVENOUS FLUIDS:2300 mL:  ESTIMATED BLOOD LOSS: 612 mL URINE OUTPUT:  100 ml SPECIMENS: Placenta sent to L&D COMPLICATIONS: None immediate  PROCEDURE IN DETAIL:  The patient preoperatively received intravenous antibiotics and had sequential compression devices applied to her lower extremities.  She was then taken to the operating room where spinal anesthesia was administered  and was found to be adequate. She was then placed in a dorsal supine  position with a leftward tilt, and prepped and draped in a sterile manner.  A foley catheter was placed into her bladder and attached to constant gravity.    After an adequate timeout was performed, a Pfannenstiel skin incision was made with scalpel over her preexisting scar and carried through to the underlying layer of fascia. The fascia was incised in the midline, and this incision was extended bilaterally using the Mayo scissors.  Kocher clamps were applied to the superior aspect of the fascial incision and the underlying rectus muscles were dissected off bluntly.  A similar process was carried out on the inferior aspect of the fascial incision. The rectus muscles were separated in the midline bluntly and the peritoneum was entered bluntly. A bladder flap was created. Attention was turned to the lower uterine segment where a low transverse hysterotomy was made with a scalpel and extended bilaterally bluntly.  The infant was successfully delivered, the cord was clamped and cut after one minute, and the infant was handed over to the awaiting neonatology team. Uterine massage was then administered, and the placenta delivered intact with a three-vessel cord. The uterus was then cleared of clots and debris.  The hysterotomy was closed with 0 Chromic in a running locked fashion, and an imbricating layer was also placed with 0 Chromic.  Bladder flap was closed with 0 Vicryl in a running fashion.The pelvis was cleared of all clot and debris. Hemostasis was confirmed on all surfaces.  The peritoneum and rectus muscles were closed with a 0 Vicryl running stitch. The fascia was then closed using 0 Vicryl in a running fashion.  The subcutaneous layer was irrigated, then reapproximated with 2-0 plain gut interrupted stitches. The skin was closed with a 4-0 Vicryl subcuticular stitch.  30 ml  of 0.5% Marcaine was injected around the incision.   The patient tolerated the procedure well. Sponge, lap, instrument and needle  counts were correct x 3.  She was taken to the recovery room in stable condition.   An experienced assistant was required given the standard of surgical care given the complexity of the case.  This assistant was needed for exposure, dissection, suctioning, retraction, instrument exchange, assisting with delivery with administration of fundal pressure, and for overall help during the procedure.   Maternal Disposition: PACU - hemodynamically stable.   Infant Disposition: stable   Marcy Sirenatherine Wallace, D.O. OB Fellow  10/28/2017, 11:23 AM

## 2017-10-28 NOTE — Addendum Note (Signed)
Addendum  created 10/28/17 1917 by Renford DillsMullins, Kahealani Yankovich L, CRNA   Sign clinical note

## 2017-10-28 NOTE — Anesthesia Postprocedure Evaluation (Signed)
Anesthesia Post Note  Patient: [name]  Procedure(s) Performed: REPEAT CESAREAN SECTION (N/A Abdomen)     Patient location during evaluation: Mother Baby Anesthesia Type: Spinal Level of consciousness: awake Pain management: satisfactory to patient Vital Signs Assessment: post-procedure vital signs reviewed and stable Respiratory status: spontaneous breathing Cardiovascular status: stable Anesthetic complications: no    Last Vitals:  Vitals:   10/28/17 1340 10/28/17 1440  BP: (!) 96/54 (!) 83/57  Pulse: 73 69  Resp: 15 16  Temp: 36.6 C 36.8 C  SpO2: 96% 97%    Last Pain:  Vitals:   10/28/17 1440  TempSrc: Oral  PainSc: 0-No pain   Pain Goal: Patients Stated Pain Goal: 7 (10/28/17 0847)               Cephus ShellingBURGER,[name]

## 2017-10-28 NOTE — Lactation Note (Signed)
This note was copied from a baby's chart. Lactation Consultation Note  Patient Name: Savannah Wade ZOXWR'UToday's Date: 10/28/2017 Reason for consult: Initial assessment;Term P1, 10 hour female infant, mom had c/s delivery. Per mom, BF her eldest daughter for 10 months. Mom has medela DEBP at home. Per parents infant had 2 wet and 2 soiled diapers in past 10 hours of life. LC entered room, mom latched infant to right breast in cross -cradle position.Infant with deep latch, wide mouth gape w/ chin down swallowing observed.  Mom was still BF as LC left room, BF 10 minutes. Latch 9 Mom is confident in her breastfeeding abilities. LC discussed I&O. Reviewed Baby & Me book's Breastfeeding Basics.  Mom encouraged to feed baby 8-12 times/24 hours and with feeding cues.  Mom made aware of O/P services, breastfeeding support groups, community resources, and our phone # for post-discharge questions.   Maternal Data Formula Feeding for Exclusion: No Has patient been taught Hand Expression?: Yes([facility] Does the patient have breastfeeding experience prior to this delivery?: Yes  Feeding Feeding Type: Breast Fed Length of feed: 10 min(Mom still BF as LC left room.)  LATCH Score Latch: Grasps breast easily, tongue down, lips flanged, rhythmical sucking.  Audible Swallowing: Spontaneous and intermittent  Type of Nipple: Everted at rest and after stimulation  Comfort (Breast/Nipple): Soft / non-tender  Hold (Positioning): Assistance needed to correctly position infant at breast and maintain latch.  LATCH Score: 9  Interventions Interventions: Breast feeding basics reviewed;Support pillows;Position options;Hand express;Breast massage;Breast compression;Adjust position  Lactation Tools Discussed/Used WIC Program: No   Consult Status Consult Status: Follow-up Date: 10/29/17 Follow-up type: In-patient    Savannah Wade 10/28/2017, 9:23 PM

## 2017-10-28 NOTE — Anesthesia Preprocedure Evaluation (Signed)
Anesthesia Evaluation  Patient identified by MRN, date of birth, ID band Patient awake    Reviewed: Allergy & Precautions, H&P , NPO status , Patient's Chart, lab work & pertinent test results  Airway Mallampati: I  TM Distance: >3 FB Neck ROM: full    Dental no notable dental hx. (+) Teeth Intact   Pulmonary neg pulmonary ROS, former smoker,    Pulmonary exam normal breath sounds clear to auscultation       Cardiovascular Exercise Tolerance: Good negative cardio ROS Normal cardiovascular exam Rhythm:regular Rate:Normal     Neuro/Psych negative neurological ROS  negative psych ROS   GI/Hepatic negative GI ROS, Neg liver ROS,   Endo/Other  negative endocrine ROS  Renal/GU negative Renal ROS  negative genitourinary   Musculoskeletal negative musculoskeletal ROS (+)   Abdominal (+) + obese,   Peds  Hematology negative hematology ROS (+)   Anesthesia Other Findings   Reproductive/Obstetrics (+) Pregnancy                             Anesthesia Physical  Anesthesia Plan  ASA: II  Anesthesia Plan: Spinal   Post-op Pain Management:    Induction:   PONV Risk Score and Plan: 3 and Ondansetron, Dexamethasone and Scopolamine patch - Pre-op  Airway Management Planned: Natural Airway and Nasal Cannula  Additional Equipment:   Intra-op Plan:   Post-operative Plan:   Informed Consent: I have reviewed the patients History and Physical, chart, labs and discussed the procedure including the risks, benefits and alternatives for the proposed anesthesia with the patient or authorized representative who has indicated his/her understanding and acceptance.     Plan Discussed with:   Anesthesia Plan Comments:         Anesthesia Quick Evaluation

## 2017-10-28 NOTE — Anesthesia Postprocedure Evaluation (Signed)
Anesthesia Post Note  Patient: Lanny HurstMegan Artist  Procedure(s) Performed: REPEAT CESAREAN SECTION (N/A Abdomen)     Patient location during evaluation: Mother Baby Anesthesia Type: Spinal Level of consciousness: awake Pain management: pain level controlled Vital Signs Assessment: post-procedure vital signs reviewed and stable Respiratory status: spontaneous breathing Cardiovascular status: stable Postop Assessment: no headache, no backache, spinal receding, patient able to bend at knees and no apparent nausea or vomiting Anesthetic complications: no    Last Vitals:  Vitals:   10/28/17 1440 10/28/17 1600  BP: (!) 83/57 (!) 105/54  Pulse: 69 69  Resp: 16 18  Temp: 36.8 C 36.7 C  SpO2: 97% 97%    Last Pain:  Vitals:   10/28/17 1600  TempSrc: Oral  PainSc: 0-No pain   Pain Goal: Patients Stated Pain Goal: 7 (10/28/17 0847)               Fanny DanceMULLINS,Delitha Elms

## 2017-10-29 ENCOUNTER — Encounter (HOSPITAL_COMMUNITY): Payer: Self-pay | Admitting: Obstetrics and Gynecology

## 2017-10-29 LAB — CBC
HCT: 29.2 % — ABNORMAL LOW (ref 36.0–46.0)
Hemoglobin: 9.6 g/dL — ABNORMAL LOW (ref 12.0–15.0)
MCH: 28.7 pg (ref 26.0–34.0)
MCHC: 32.9 g/dL (ref 30.0–36.0)
MCV: 87.4 fL (ref 78.0–100.0)
PLATELETS: 152 10*3/uL (ref 150–400)
RBC: 3.34 MIL/uL — ABNORMAL LOW (ref 3.87–5.11)
RDW: 15.3 % (ref 11.5–15.5)
WBC: 8.7 10*3/uL (ref 4.0–10.5)

## 2017-10-29 LAB — CREATININE, SERUM
Creatinine, Ser: 0.59 mg/dL (ref 0.44–1.00)
GFR calc Af Amer: >60 mL/min (ref >60)
GFR calc non Af Amer: >60 mL/min (ref >60)

## 2017-10-29 LAB — BIRTH TISSUE RECOVERY COLLECTION (PLACENTA DONATION)

## 2017-10-29 MED ORDER — INFLUENZA VAC SPLIT QUAD 0.5 ML IM SUSY
0.5000 mL | PREFILLED_SYRINGE | INTRAMUSCULAR | Status: AC
  Administered 2017-10-30: 0.5 mL via INTRAMUSCULAR
  Filled 2017-10-29: qty 0.5

## 2017-10-29 NOTE — Progress Notes (Signed)
Post Partum Day #1  Subjective: Pt is a 27 yo G3P2012 129w0d s/p rLTCS w/ pregnancy course complicated by polyhydramnios, GBS +, rubella nonimmune status, and HSV treated prophylactically at 35 wks. Pt is doing well today w/ no complaints and no significant overnight events. Normal lochia w/ minimal vaginal bleeding. She is tolerating PO, urinating normally, +flatus. Denies any abdominal pain, pain or excessive drainage at the incision site, nausea, vomiting, HA, or blurred vision. She is breastfeeding and requests POP for contraception.   Objective: Blood pressure (!) 86/60, pulse 70, temperature 98.5 F (36.9 C), temperature source Oral, resp. rate 18, height 5\' 2"  (1.575 m), weight 84.6 kg, last menstrual period 11/25/2016, SpO2 99 %, unknown if currently breastfeeding.  Physical Exam:  General: alert, cooperative, appears stated age and no distress Lochia: appropriate Uterine Fundus: firm Incision: healing well, no significant drainage, no dehiscence DVT Evaluation: No evidence of DVT seen on physical exam.  Recent Labs    10/26/17 1803 10/29/17 0556  HGB 12.0 9.6*  HCT 36.6 29.2*    Assessment/Plan: Plan for discharge tomorrow, Breastfeeding. POP for contraception.    LOS: 1 day   [facility] Riely Baskett 10/29/2017, 9:32 AM

## 2017-10-29 NOTE — Progress Notes (Signed)
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  CSW is screening out referral since there is no evidence to support need to address ADD at this time.    Please contact CSW by MOB's request, if it is noted that history begins to impact patient care, if there are concerns about bonding, or if MOB scores 10 or greater/yes to question 10 on the Edinburgh Postnatal Depression Scale.    Heitor Steinhoff Boyd-Gilyard, MSW, LCSW Clinical Social Work [phone]   

## 2017-10-30 MED ORDER — FERROUS SULFATE 325 (65 FE) MG PO TABS: 325.0000 mg | ORAL_TABLET | Freq: Two times a day (BID) | ORAL | 1 refills | Status: DC

## 2017-10-30 MED ORDER — IBUPROFEN 800 MG PO TABS: 800.0000 mg | ORAL_TABLET | Freq: Three times a day (TID) | ORAL | 3 refills | Status: DC | PRN

## 2017-10-30 MED ORDER — MEASLES, MUMPS & RUBELLA VAC ~~LOC~~ INJ: 0.5000 mL | INJECTION | Freq: Once | SUBCUTANEOUS | Status: DC

## 2017-10-30 MED ORDER — IBUPROFEN 800 MG PO TABS: 800.0000 mg | ORAL_TABLET | Freq: Three times a day (TID) | ORAL | 0 refills | Status: DC

## 2017-10-30 MED ORDER — DOCUSATE SODIUM 100 MG PO CAPS: 100.0000 mg | ORAL_CAPSULE | Freq: Two times a day (BID) | ORAL | 2 refills | Status: DC | PRN

## 2017-10-30 MED ORDER — IBUPROFEN 800 MG PO TABS: 800.0000 mg | ORAL_TABLET | Freq: Three times a day (TID) | ORAL | 2 refills | Status: DC | PRN

## 2017-10-30 MED ORDER — OXYCODONE HCL 5 MG PO TABS: 5.0000 mg | ORAL_TABLET | ORAL | 0 refills | Status: DC | PRN

## 2017-10-30 NOTE — Progress Notes (Signed)
Dr Earlene PlaterWallace was notified of pt's report of SOB while ambulating in hall. Lungs are clear, oxygen saturation is 100%, VS reviewed and H/H. Order received to cancel the discharge and H/H in AM.

## 2017-10-30 NOTE — Discharge Instructions (Signed)

## 2017-10-30 NOTE — Progress Notes (Signed)
Discharge order cancelled due to infant staying. Will see mom on PP rounds tomorrow and anticipate d/c home tomorrow.   Marcy Sirenatherine Khaleah Duer, D.O. [facility] Family [facility] Fellow, [facility], [facility] 10/30/2017, 6:19 PM

## 2017-10-30 NOTE — Lactation Note (Signed)
This note was copied from a baby's chart. Lactation Consultation Note  Patient Name: Savannah Lanny HurstMegan Gainer ZOXWR'UToday's Date: 10/30/2017 Reason for consult: Follow-up assessment;Term  P2 mother whose infant is now 5649 hours old.  Mother is waiting for a discharge today from the pediatrician.  Mother has no questions/concerns related to breastfeeding.  She breast fed her first child for 10 months.  Engorgement prevention/treatment discussed.  Mother does not want a manual pump for home use.  She has a DEBP.    Family and visitors in room.  Mother will call for any concerns prior to discharge.  RN in room and aware.   Maternal Data Formula Feeding for Exclusion: No Has patient been taught Hand Expression?: Yes Does the patient have breastfeeding experience prior to this delivery?: Yes  Feeding Feeding Type: Breast Fed Length of feed: 20 min  LATCH Score Latch: Grasps breast easily, tongue down, lips flanged, rhythmical sucking.  Audible Swallowing: A few with stimulation  Type of Nipple: Everted at rest and after stimulation  Comfort (Breast/Nipple): Soft / non-tender  Hold (Positioning): No assistance needed to correctly position infant at breast.  LATCH Score: 9  Interventions    Lactation Tools Discussed/Used WIC Program: No   Consult Status Consult Status: Complete Date: 10/30/17 Follow-up type: Call as needed    Savannah Wade 10/30/2017, 11:37 AM

## 2017-10-30 NOTE — Discharge Summary (Addendum)
Obstetrics Discharge Summary OB/GYN Faculty Practice   Patient Name: Savannah HurstMegan Wade DOB: 21-Oct-1990 MRN: [id number]  Date of admission: 10/28/2017 Delivering MD: Hermina StaggersERVIN, MICHAEL L   Date of discharge: 10/30/2017  Admitting diagnosis: RCS Intrauterine pregnancy: 3342w0d     Secondary diagnosis:   Active Problems:   Status post repeat low transverse cesarean section   Additional problems:  . Polyhydramnios . Rubella non immune . Cephalopelvic disproportion . HSV     Discharge diagnosis: Term Pregnancy Delivered                                            Postpartum procedures: None  Complications: none  Hospital course: Savannah Wade is a 27 y.o. 342w0d who was admitted for scheduled RLCTS . Her pregnancy was complicated by Polyhydramnios, cephalopelvic disproportion, HSV. Delivery was uncomplicated. Please see delivery/op note for additional details. Her postpartum course was complicated by mild hypotension, pt remained asymptomatic and on discharge her BP had stabilized w/o intervention. She was breastfeeding without difficulty. By day of discharge, she was passing flatus, urinating, eating and drinking without difficulty. Her pain was well-controlled, and she was discharged home with tylenol/motrin. She will follow-up in clinic in 2 weeks.   Physical exam  Vitals:   10/29/17 1307 10/29/17 1355 10/29/17 2245 10/30/17 0700  BP: 93/60 92/60 (!) 86/50 (!) 92/56  Pulse: 64 67 63 62  Resp: 18 18 16 16   Temp: 98.2 F (36.8 C) 98.6 F (37 C) 98.7 F (37.1 C) 98 F (36.7 C)  TempSrc: Oral Oral Oral Oral  SpO2: 99% 99% 97% 98%  Weight:      Height:       General: aao, nad Lochia: appropriate Uterine Fundus: firm Incision: Dressing is clean, dry, and intact DVT Evaluation: No evidence of DVT seen on physical exam. Labs: Lab Results  Component Value Date   WBC 8.7 10/29/2017   HGB 9.6 (L) 10/29/2017   HCT 29.2 (L) 10/29/2017   MCV 87.4 10/29/2017   PLT 152 10/29/2017   CMP  Latest Ref Rng & Units 10/29/2017  Glucose 65 - 104 mg/dL -  Creatinine 4.090.44 - 8.111.00 mg/dL 9.140.59    Discharge instructions: Per After Visit Summary and "Baby and Me Booklet"  After visit meds:  Allergies as of 10/30/2017      Reactions   Sulfa Antibiotics Hives      Medication List    TAKE these medications   acetaminophen 500 MG tablet Commonly known as:  TYLENOL Take 500 mg by mouth daily as needed for mild pain.   calcium carbonate 500 MG chewable tablet Commonly known as:  TUMS - dosed in mg elemental calcium Chew 2 tablets by mouth daily as needed for indigestion or heartburn.   docusate sodium 100 MG capsule Commonly known as:  COLACE Take 1 capsule (100 mg total) by mouth 2 (two) times daily as needed for mild constipation or moderate constipation.   ferrous sulfate 325 (65 FE) MG tablet Take 1 tablet (325 mg total) by mouth 2 (two) times daily.   ibuprofen 800 MG tablet Commonly known as:  ADVIL,MOTRIN Take 1 tablet (800 mg total) by mouth every 8 (eight) hours as needed for moderate pain or cramping.   oxyCODONE 5 MG immediate release tablet Commonly known as:  Oxy IR/ROXICODONE Take 1 tablet (5 mg total) by mouth every 4 (four) hours as needed  for severe pain or breakthrough pain (pain scale 4-7).   PRENATAL VITAMINS PO Take 1 tablet by mouth daily.   valACYclovir 1000 MG tablet Commonly known as:  VALTREX Take 1,000 mg by mouth daily.            Discharge Care Instructions  (From admission, onward)         Start     Ordered   10/30/17 0000  Discharge wound care:    Comments:  As per discharge handout and nursing instructions   10/30/17 1002          Postpartum contraception: Progesterone only pills Diet: Routine Diet Activity: Advance as tolerated. Pelvic rest for 6 weeks.   Outpatient follow up:2 weeks Follow-up Appt:No future appointments. Follow-up Visit:No follow-ups on file.  Newborn Data: Live born female  Birth Weight: 7 lb 9 oz  (3430 g) APGAR: 7, 9  Newborn Delivery   Birth date/time:  10/28/2017 10:32:00 Delivery type:  C-Section, Vacuum Assisted Trial of labor:  No C-section categorization:  Repeat     Baby Feeding: Breast Disposition:home with mother   Denzil Hughes, MD (PGY2)  Midwife attestation I have seen and examined this patient and agree with above documentation in the resident's note.   Savannah Wade is a 27 y.o. [id number] s/p rCS.  Pain is well controlled. Plan for birth control is oral progesterone-only contraceptive. Method of Feeding: breast  PE:  Gen: well appearing Heart: reg rate Lungs: normal WOB Fundus firm Ext: no pain, no edema  Recent Labs    10/29/17 0556 10/31/17 0550  HGB 9.6* 9.6*  HCT 29.2* 29.6*     Assessment S/p rLTCS PPD # 3  Plan: - discharge home - postpartum care discussed - f/u clinic in 6 weeks for postpartum visit - f/u clinic 2 weeks for incision check   Donette Larry, CNM 9:37 AM

## 2017-10-31 LAB — HEMOGLOBIN AND HEMATOCRIT, BLOOD
HCT: 29.6 % — ABNORMAL LOW (ref 36.0–46.0)
HEMOGLOBIN: 9.6 g/dL — AB (ref 12.0–15.0)

## 2017-10-31 NOTE — Lactation Note (Signed)
This note was copied from a baby's chart. Lactation Consultation Note; experienced BF mom reports breast feeding is going well. Reports she cluster fed through the night. Reports one nipple is a little cracked but she has been applying EBM to it after feedings. No questions at present. To call prn  Patient Name: Savannah Wade ZOXWR'UToday's Date: 10/31/2017 Reason for consult: Follow-up assessment   Maternal Data Formula Feeding for Exclusion: No Has patient been taught Hand Expression?: Yes Does the patient have breastfeeding experience prior to this delivery?: Yes  Feeding LATCH Score                   Interventions    Lactation Tools Discussed/Used     Consult Status Consult Status: Complete    Pamelia HoitWeeks, Erian Lariviere D 10/31/2017, 8:42 AM

## 2017-11-11 ENCOUNTER — Encounter: Payer: Self-pay | Admitting: Obstetrics & Gynecology

## 2017-11-11 ENCOUNTER — Ambulatory Visit (INDEPENDENT_AMBULATORY_CARE_PROVIDER_SITE_OTHER): Payer: BLUE CROSS/BLUE SHIELD | Admitting: Obstetrics & Gynecology

## 2017-11-11 VITALS — BP 100/68 | HR 68 | Ht 64.0 in | Wt 161.0 lb

## 2017-11-11 DIAGNOSIS — Z9889 Other specified postprocedural states: Secondary | ICD-10-CM

## 2017-11-11 NOTE — Progress Notes (Signed)
   Subjective:    Patient ID: Savannah Wade, female    DOB: 1990-08-14, 27 y.o.   MRN: [id number]  HPI 27 yo married P2 here for an incision check. She is having no problems. She would like POPs for contraception.   Review of Systems     Objective:   Physical Exam Breathing, conversing, and ambulating normally Well nourished, well hydrated White female, no apparent distress Incision- healed well Abd- benign     Assessment & Plan:  Post op - doing well Start POPs Rec pelvic rest for a total of 6 weeks She already has a postpartum appt scheduled.

## 2017-11-17 ENCOUNTER — Ambulatory Visit: Payer: BLUE CROSS/BLUE SHIELD | Admitting: *Deleted

## 2017-11-17 ENCOUNTER — Encounter: Payer: Self-pay | Admitting: *Deleted

## 2017-11-17 VITALS — BP 100/66 | HR 69 | Temp 97.6°F | Ht 62.0 in | Wt 160.0 lb

## 2017-11-17 DIAGNOSIS — B372 Candidiasis of skin and nail: Secondary | ICD-10-CM

## 2017-11-17 MED ORDER — FLUCONAZOLE 150 MG PO TABS: 150.0000 mg | ORAL_TABLET | Freq: Once | ORAL | 0 refills | Status: AC

## 2017-11-17 NOTE — Progress Notes (Signed)
Pt here for 3 week C-section incisional check.  She states that it is draining and is red around the incision.  She was afraid that she may have an infection.  I checked the incision and a clear d/c was present and a couple of 2 pin point areas were open.  Area cleaned and steri-strips were placed over the open area.  The area does not appear to be infected and pt is not running a fever.  There is some redness below the incision line that appear to be yeast and does have that smell of yeast.  Area dried and 4x4 folded and placed to help with absorbency.  Advised pt when showering to just use her hand and soap and water to clean.  Use a hair dryer on low to dry the area.  RX for Diflucan sent to her pharmacy and advised her to get an anti-fungal cream to place on the yeast are but not on the incision.  She does have a f/u appt next week with MD.  We will see her then or prn if needed before.  She was also worried about mastitis on her Rt breast.  She says it feels like needles when baby is nursing.  After seeing her nipple it does look like it is irritated and may have a small scab present.  She does state that baby is aggressive with that nipple and sometimes doesn't get a good latch.  I recommended lanolin be placed on nipple after feedings but to wash off before feedings.  She states that she is rubbing the nipples with breast milk too.  I explained the s&s of mastitis.  She agrees to call if she has any of those symptoms.

## 2017-11-25 ENCOUNTER — Encounter: Payer: Self-pay | Admitting: Obstetrics & Gynecology

## 2017-11-25 ENCOUNTER — Ambulatory Visit (INDEPENDENT_AMBULATORY_CARE_PROVIDER_SITE_OTHER): Payer: BLUE CROSS/BLUE SHIELD | Admitting: Obstetrics & Gynecology

## 2017-11-25 DIAGNOSIS — Z1389 Encounter for screening for other disorder: Secondary | ICD-10-CM | POA: Diagnosis not present

## 2017-11-25 NOTE — Progress Notes (Signed)
Post Partum Exam  Savannah Wade is a 27 y.o. G29P2012 female who presents for a postpartum visit. She is 4 weeks postpartum following a low cervical transverse Cesarean section. I have fully reviewed the prenatal and intrapartum course. The delivery was at 39 gestational weeks for polyhydramnios.   Anesthesia: epidural. Postpartum course has been unremarkable except for yeast below incision.. Baby's course has been unremarkable. Baby is feeding by breast. Bleeding no bleeding. Bowel function is normal. Bladder function is normal. Patient is not sexually active. Contraception method is PO pill.. Postpartum depression screening:neg  The following portions of the patient's history were reviewed and updated as appropriate: allergies, current medications, past family history, past medical history, past social history, past surgical history and problem list.   Review of Systems Pertinent items are noted in HPI.    Objective:  Last menstrual period 11/25/2016, currently breastfeeding.  General:  alert   Breasts:  inspection negative, no nipple discharge or bleeding, no masses or nodularity palpable  Lungs: clear to auscultation bilaterally  Heart:  regular rate and rhythm, S1, S2 normal, no murmur, click, rub or gallop  Abdomen: soft, non-tender; bowel sounds normal; no masses,  no organomegaly, incision healing well. There are 2 very small (less than 5 mm) areas at the right side of the incision that are open, not deep when probed with a Qtip (not the cotton end).    Vulva:  not evaluated  Vagina: not evaluated  Cervix:  not evaluated  Corpus: not examined  Adnexa:  not evaluated  Rectal Exam: Not performed.        Assessment:   Normal postpartum exam. Pap smear not done at today's visit.   Plan:   1. Contraception: oral progesterone-only contraceptive 2. She will clean the incision with hydrogen peroxide BID 3. Follow up in: 1 year or as needed.

## 2017-12-22 DIAGNOSIS — H53143 Visual discomfort, bilateral: Secondary | ICD-10-CM | POA: Diagnosis not present

## 2018-04-07 ENCOUNTER — Ambulatory Visit: Payer: Self-pay

## 2018-04-07 NOTE — Lactation Note (Signed)
This note was copied from a baby's chart. Lactation Consultation Note  Patient Name: Savannah Wade Date: 04/07/2018   04/07/2018  Name: Savannah Wade MRN: [id number] Date of Birth: 10/28/2019 Gestational Age: Gestational Age: [redacted]w[redacted]d Birth Weight: 121 oz Weight today:    16 pounds 4.3 ounces (7664 grams) with clean size 25 diaper  62 month old infant presents today with mom for feeding assessment. Mom is experiencing pain on her right breast with every feeding. She does not have pain on the left breast.   Infant feeds about every 2-3 hours at the breast. She usually feeds on one breast per feeding. Infant still feeding 3 x a night also. Infant is in the distracted stage and is distracted easily with feedings.   -Mom denies thrush in infant.  -Mom feeds in the cradle position for each feeding.  -Pain is a level 3 with feeding and for a period of time post feeding.  -Nipple is asymmetrical post feeding on the right breast, rounded on the left breast.  -Infant clicks some on the breast. -Infant chokes on the right breast with most feedings. -Right side used to be the higher producing side then mom nursed x 2 on the left breast and once on the right due to pain. It feels better when fuller and when she allows it to rest.  -Mom reports nipple was cracked in the beginning, mom tried Lanolin and reports it actually felt worse.  -Right nipple needs a warm compress post feeding due to pain.  -Infant has 2 lower teeth, she does bite per mom.  -Mom reports infant was a biter/clamper in the beginning, mom with trauma and would have to pump and bottle feed until healed.  - infant does not have a history of spitting - infant does not have signs of reflux - infant was pretty gassy up to about 2 months and has improved. Mom had to limit some foods in the beginning. Mom is not needing to limit her diet at this time.  -infant does tummy time for the most 15 minutes at the time - infant not able  to roll over yet -infant on Mylicon for about 3 months and then was weaned off -from 2.5 months-4 months infant slept 8 hours  Infant noted to have lip restriction. Infant with several signs of posterior lingual frenulum. Website and Local provider information given. Mom and dad to discuss and decide if they want to have infant evaluated.    Infant has started on solids in the last month. She gets about 1.5 ice cube sized serving once a day. Mom makes infants food. Infant is nursed post solid foods.   Infant will not take a bottle or sippy cup. Infant took Dr. Theora Gianotti bottles with formula up to 3 months. She will no longer take a bottle. Mom has tried many bottles with newborn and size 1 nipples and occasionally she will take 1 ounce from an [employer] bottle. Mom has tried sippy cups also and infant will not take them. Infant chokes on bottles a lot of the time per mom.   Mom has milk in the freezer. Mom is not pumping at this time as infant is feeding so often.   Infant latched to the right breast in the cradle hold. Infant fed well for about 10-15 minutes. Infant choked once with the feeding and mom pulled her off to recover. Infant became unsettled as the feeding progressed. Infant then self released post feeding.   Infant  to follow up with Dr. Hartley Barefoot     General Information: Mother's reason for visit: nipple pain on the right breast Consult: Initial Lactation consultant: Jasmine December Duval Macleod RN,IBCLC Breastfeeding experience: nipple pain to right nipple, will not take a bottle   Maternal medications: Pre-natal vitamin  Breastfeeding History: Frequency of breast feeding: every 2-3 hours Duration of feeding: 10-15 minutes  Supplementation: Supplement method: (will not take bottles or sippy cup)               Pump type: Medela pump in style Pump frequency: not pumping    Infant Output Assessment: Voids per 24 hours: 8-12 Urine color: Clear yellow Stools per 24 hours:  1-2/day Stool color: Yellow  Breast Assessment: Breast: Soft, Compressible Nipple: Erect, Other(compressed and asymmetrical post feeding) Pain level: 3 Pain interventions: Bra  Feeding Assessment: Infant oral assessment: Variance Infant oral assessment comment: Infant with thick labial frenulum that wraps around her gum ridge. Upper lip very tight with flanging and on the breast. infant with good tongue extension and lateralization. infant would not suckle on gloved finger. Infant with signs of posterior lingual frenulum. infant would not suckle on gloved finger. infant bites and chomps on finger. infant with high palate. Mom with pain with feeding and compressed nipple on the left breast.  Positioning: Cradle(right breast, 15 minutes) Latch: 1 - Repeated attempts needed to sustain latch, nipple held in mouth throughout feeding, stimulation needed to elicit sucking reflex. Audible swallowing: 2 - Spontaneous and intermittent Type of nipple: 2 - Everted at rest and after stimulation Comfort: 1 - Filling, red/small blisters or bruises, mild/mod discomfort Hold: 2 - No assistance needed to correctly position infant at breast LATCH score: 8 Latch assessment: Shallow Lips flanged: No(upper lip needs flanging) Suck assessment: Displays both   Pre-feed weight: 7664 grams Post feed weight: 7792 grams Amount transferred: 128 ml    Additional Feeding Assessment:                                    Totals: Total amount transferred: 128 ml Total supplement given: 0 Total amount pumped post feed: did not pump   Plan:  1. Feed infant with feeding cues  2. Continue feeding as you have been 3. Flange upper lip with feeding 4. Consider having infant evaluated by Oral Specialist 5. Keep up the good work 6. Thank you for allowing me to assist you today 7. Please call with any questions/concerns as needed [phone] 8. Follow up with Lactation as needed or 1-5 day post  tongue and lip releases if completed  [facility] RN, IBCLC                                                         Savannah Wade 04/07/2018, 10:05 AM

## 2018-08-24 ENCOUNTER — Telehealth: Payer: Self-pay | Admitting: *Deleted

## 2018-08-24 NOTE — Telephone Encounter (Signed)
Left patient a message to call and answer screening questions prior to appointment on 08/31/2018 at 9:15am.

## 2018-08-31 ENCOUNTER — Ambulatory Visit (INDEPENDENT_AMBULATORY_CARE_PROVIDER_SITE_OTHER): Payer: BLUE CROSS/BLUE SHIELD | Admitting: Obstetrics and Gynecology

## 2018-08-31 ENCOUNTER — Other Ambulatory Visit: Payer: Self-pay

## 2018-08-31 ENCOUNTER — Encounter: Payer: Self-pay | Admitting: Obstetrics and Gynecology

## 2018-08-31 VITALS — BP 94/65 | HR 70 | Resp 16 | Ht 62.0 in | Wt 152.0 lb

## 2018-08-31 DIAGNOSIS — Z01419 Encounter for gynecological examination (general) (routine) without abnormal findings: Secondary | ICD-10-CM

## 2018-08-31 DIAGNOSIS — Z0000 Encounter for general adult medical examination without abnormal findings: Secondary | ICD-10-CM | POA: Insufficient documentation

## 2018-08-31 DIAGNOSIS — N926 Irregular menstruation, unspecified: Secondary | ICD-10-CM | POA: Diagnosis not present

## 2018-08-31 DIAGNOSIS — Z3009 Encounter for other general counseling and advice on contraception: Secondary | ICD-10-CM | POA: Insufficient documentation

## 2018-08-31 MED ORDER — NORGESTIMATE-ETH ESTRADIOL 0.25-35 MG-MCG PO TABS: 1.0000 | ORAL_TABLET | Freq: Every day | ORAL | 11 refills | Status: DC

## 2018-08-31 NOTE — Progress Notes (Signed)
GYNECOLOGY ANNUAL PREVENTATIVE CARE ENCOUNTER NOTE  History:     Savannah Wade is a 28 y.o. [phone] female here for a routine annual gynecologic exam.  Current complaints: None, up to date on HM. Here to initiate BC pills.    Denies abnormal vaginal bleeding, discharge, pelvic pain, problems with intercourse or other gynecologic concerns.    Gynecologic History No LMP recorded. Contraception: OCP (estrogen/progesterone) Last Pap: 2/6/ 2019. Results were: normal with negative HPV Last mammogram: NA.   Obstetric History OB History  Gravida Para Term Preterm AB Living  3 2 2   1 2   SAB TAB Ectopic Multiple Live Births  1     0 2    # Outcome Date GA Lbr Len/2nd Weight Sex Delivery Anes PTL Lv  3 Term 10/28/17 [redacted]w[redacted]d  7 lb 9 oz (3.43 kg) F CS-Vac Spinal  LIV  2 Term 09/09/15 [redacted]w[redacted]d  7 lb 6 oz (3.345 kg) F CS-LTranv   LIV     Complications: Cephalopelvic Disproportion  1 SAB             Past Medical History:  Diagnosis Date  . Vaginal Pap smear, abnormal     Past Surgical History:  Procedure Laterality Date  . CESAREAN SECTION N/A 09/09/2015   Procedure: CESAREAN SECTION;  Surgeon: Jonnie Kind, MD;  Location: [facility];  Service: Obstetrics;  Laterality: N/A;  . CESAREAN SECTION N/A 10/28/2017   Procedure: REPEAT CESAREAN SECTION;  Surgeon: Chancy Milroy, MD;  Location: [facility];  Service: Obstetrics;  Laterality: N/A;  . WISDOM TOOTH EXTRACTION      Current Outpatient Medications on File Prior to Visit  Medication Sig Dispense Refill  . Prenatal Multivit-Min-Fe-FA (PRENATAL VITAMINS PO) Take 1 tablet by mouth daily.     . norethindrone (MICRONOR,CAMILA,ERRIN) 0.35 MG tablet Take 1 tablet by mouth daily.     No current facility-administered medications on file prior to visit.     Allergies  Allergen Reactions  . Sulfa Antibiotics Hives    Social History:  reports that she has quit smoking. Her smoking use included cigarettes. She quit after  2.00 years of use. She has never used smokeless tobacco. She reports current alcohol use. She reports that she does not use drugs.  Family History  Problem Relation Age of Onset  . Cancer Father        testicular  . Diabetes Paternal Grandmother   . Heart attack Paternal Grandmother        bypass surgery  . Hypertension Paternal Grandmother   . Cancer - Colon Paternal Grandfather        colon  . Cancer Paternal Grandfather        prostate  . COPD Paternal Grandfather   . Heart attack Maternal Grandfather 50  . Hyperlipidemia Maternal Grandfather   . Heart attack Maternal Grandmother        x 2  . Coronary artery disease Maternal Grandmother     The following portions of the patient's history were reviewed and updated as appropriate: allergies, current medications, past family history, past medical history, past social history, past surgical history and problem list.  Review of Systems Pertinent items noted in HPI and remainder of comprehensive ROS otherwise negative.  Physical Exam:  BP 94/65   Pulse 70   Resp 16   Ht 5\' 2"  (1.575 m)   Wt 152 lb (68.9 kg)   Breastfeeding No   BMI 27.80 kg/m  CONSTITUTIONAL:  Well-developed, well-nourished female in no acute distress.  HENT:  Normocephalic, atraumatic, External right and left ear normal. Oropharynx is clear and moist EYES: Conjunctivae and EOM are normal. Pupils are equal, round, and reactive to light. No scleral icterus.  NECK: Normal range of motion, supple, no masses.  Normal thyroid.  SKIN: Skin is warm and dry. No rash noted. Not diaphoretic. No erythema. No pallor. MUSCULOSKELETAL: Normal range of motion. No tenderness.  No cyanosis, clubbing, or edema.  2+ distal pulses. NEUROLOGIC: Alert and oriented to person, place, and time. Normal reflexes, muscle tone coordination. No cranial nerve deficit noted. PSYCHIATRIC: Normal mood and affect. Normal behavior. Normal judgment and thought content. CARDIOVASCULAR: Normal  heart rate noted, regular rhythm RESPIRATORY: Clear to auscultation bilaterally. Effort and breath sounds normal, no problems with respiration noted. BREASTS: Symmetric in size. No masses, skin changes, nipple drainage, or lymphadenopathy. ABDOMEN: Soft, normal bowel sounds, no distention noted.  No tenderness, rebound or guarding.  PELVIC: Deferred    Assessment and Plan:   1. Annual physical exam  - Vitamin D (25 hydroxy) - TSH - Last Pap WNL  2. Counseling for birth control, oral contraceptives  Rx: Sprintec  Routine preventative health maintenance measures emphasized. Please refer to After Visit Summary for other counseling recommendations.    [name], Harolyn RutherfordJennifer I, NP [facility] for [facility], [facility]

## 2018-09-01 LAB — TSH: TSH: 2.08 mIU/L

## 2018-09-01 LAB — VITAMIN D 25 HYDROXY (VIT D DEFICIENCY, FRACTURES): Vit D, 25-Hydroxy: 34 ng/mL (ref 30–100)

## 2018-10-03 IMAGING — US US MFM FETAL BPP W/O NON-STRESS
1 series · 15 of 28 positions shown · non-contrast
Comparison: none

[Series 1: us mfm fetal bpp w/o non-stress · 32 acquisitions, 15 frames shown]
[im 1/32]
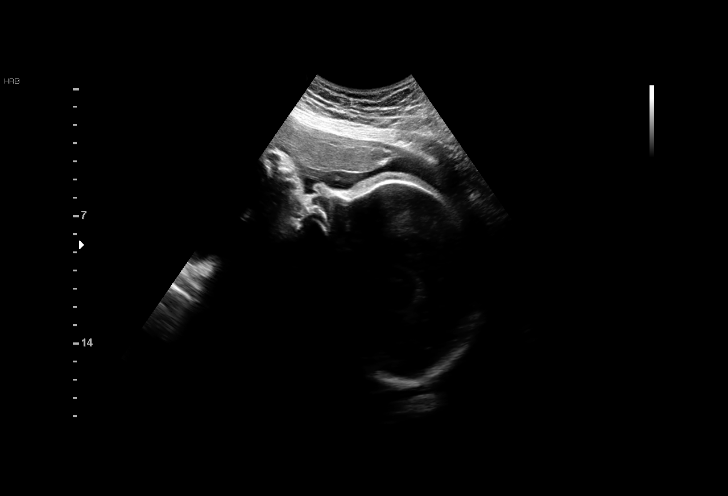
[im 3/32]
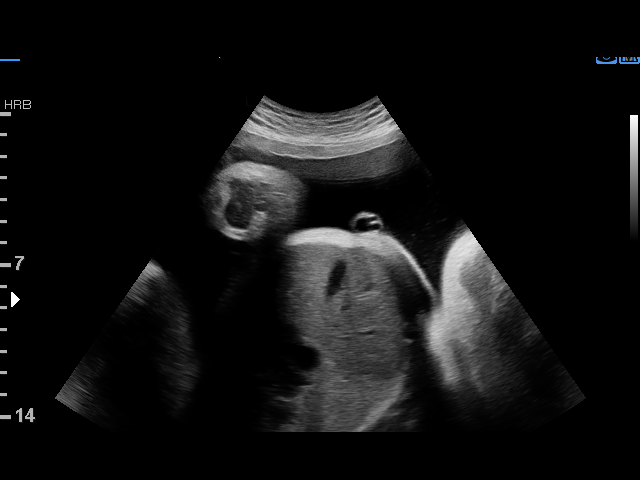
[im 5/32]
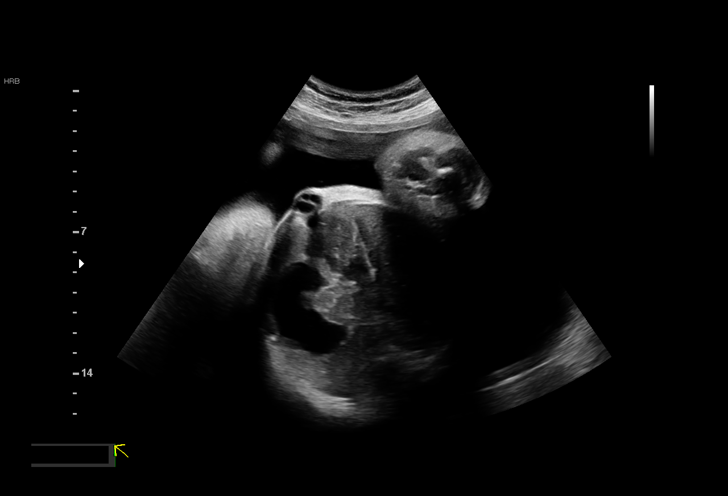
[im 7/32]
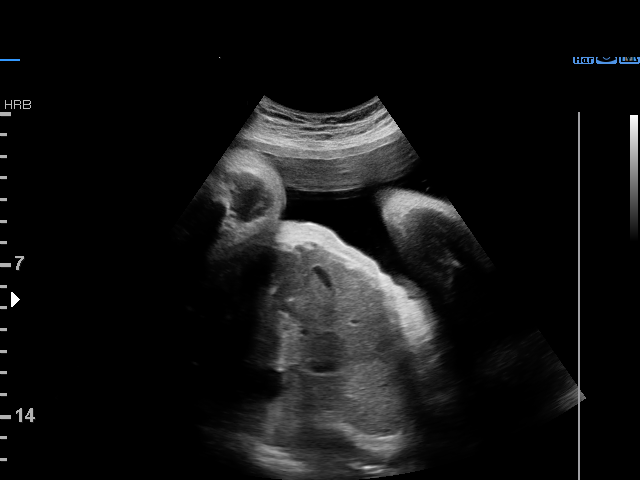
[im 10/32]
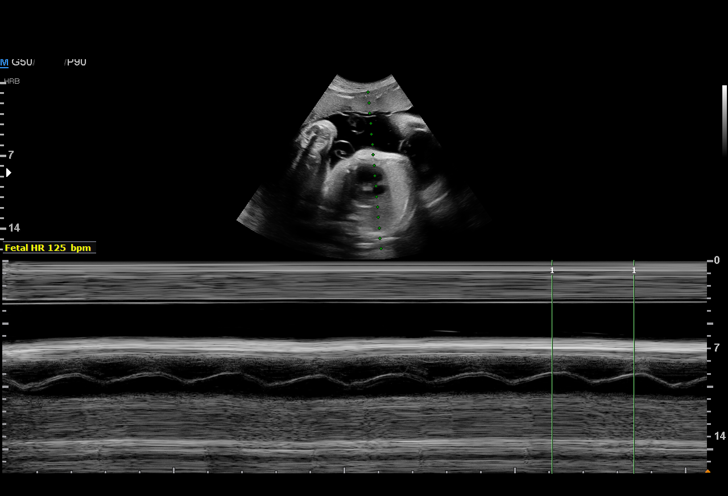
[im 12/32]
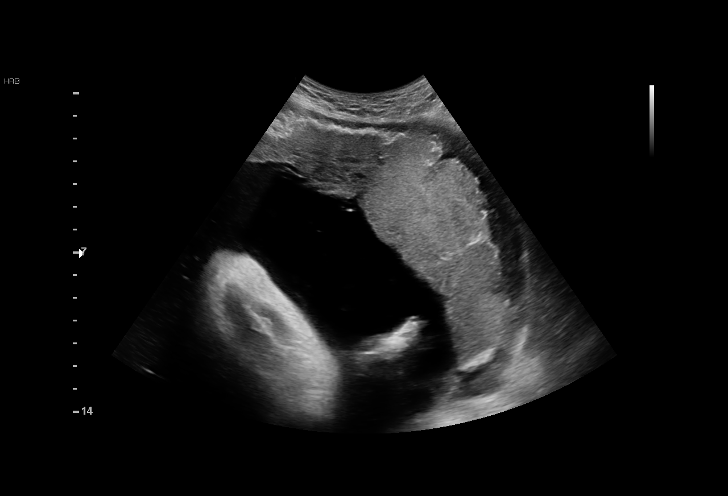
[im 14/32]
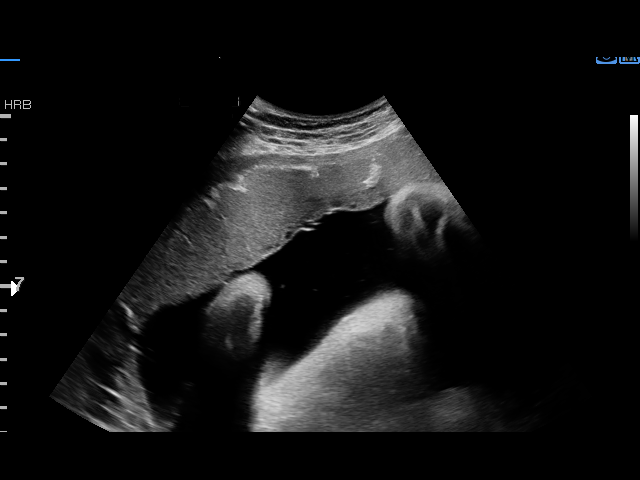
[im 17/32]
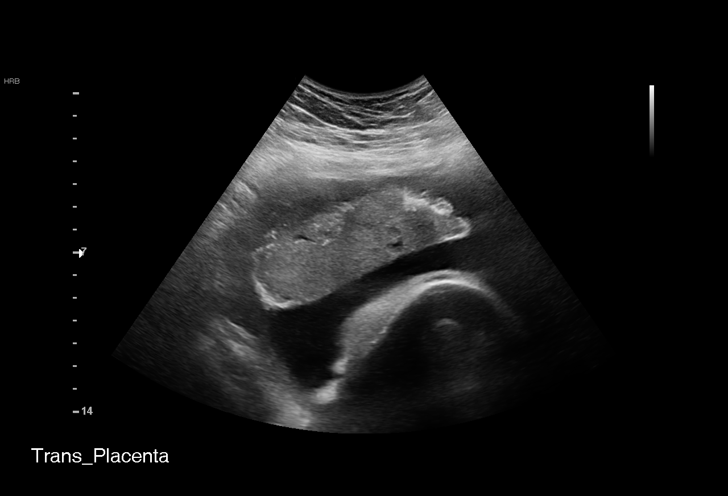
[im 18/32]
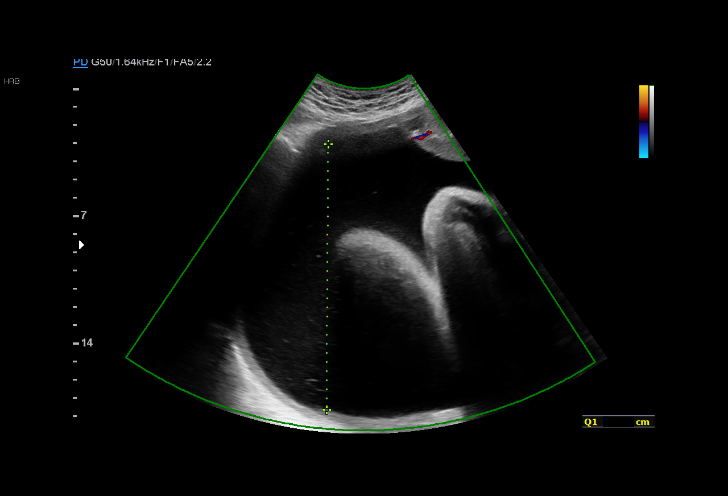
[im 20/32]
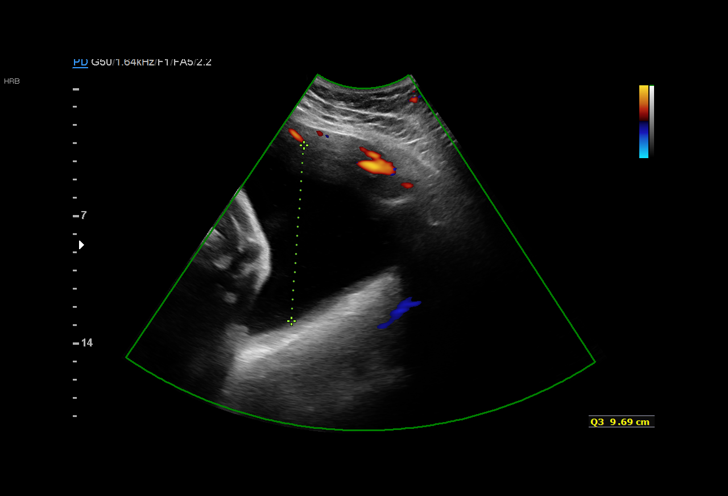
[im 22/32]
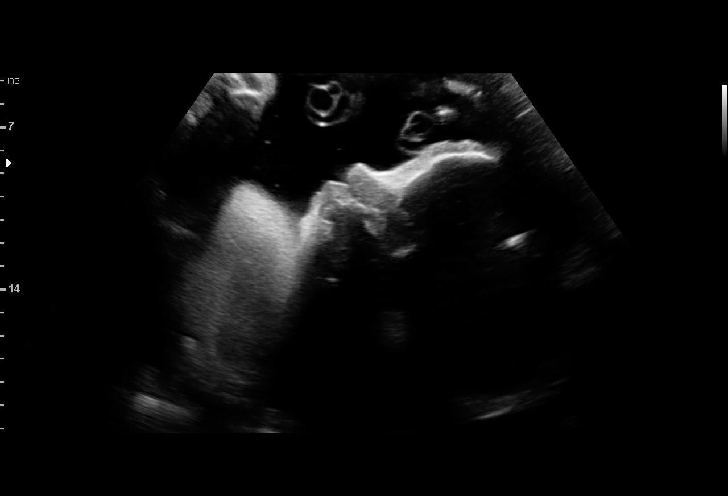
[im 25/32]
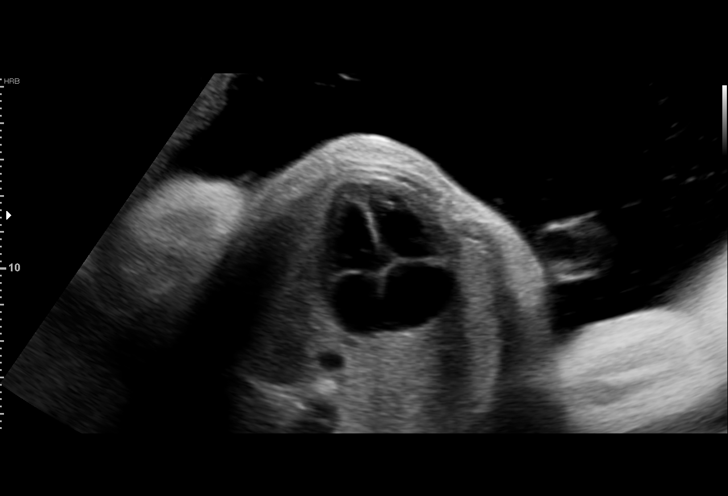
[im 27/32]
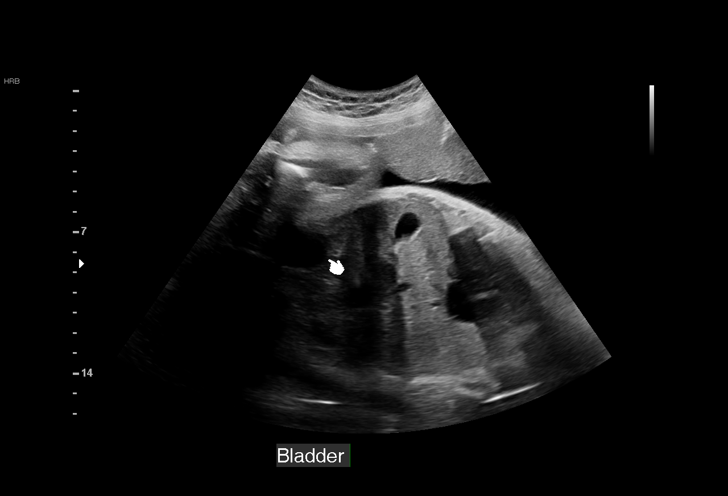
[im 29/32]
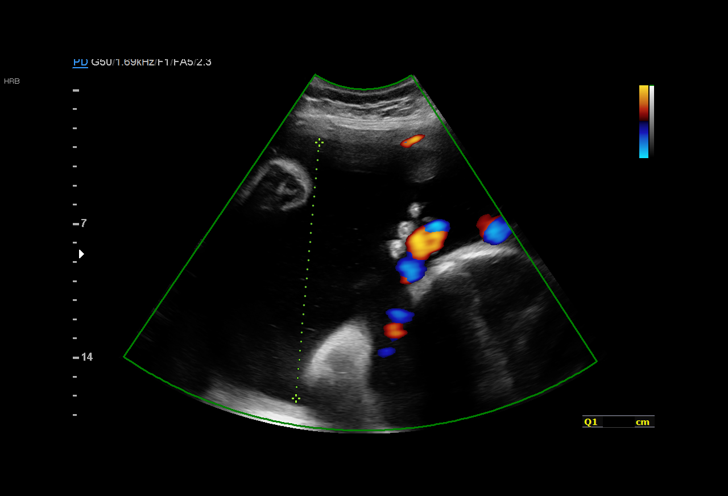
[im 32/32]
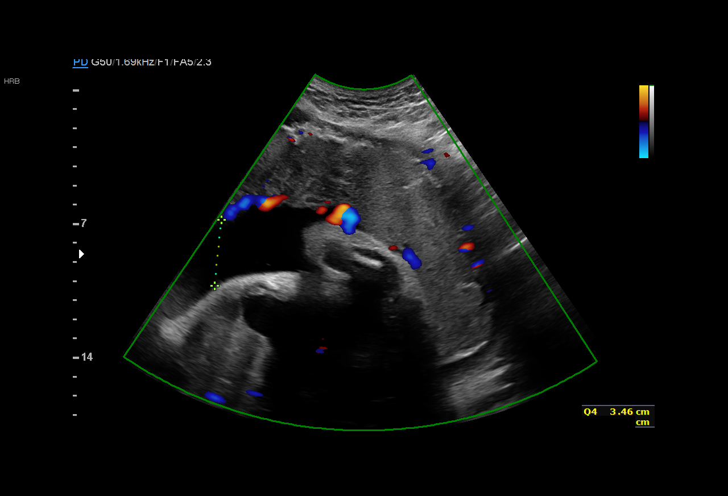

[15 of 28 positions shown; findings below may reference images not displayed]

for [REDACTED]care

Indications

Previous cesarean delivery, antepartum
35 weeks gestation of pregnancy
Polyhydramnios, third trimester, antepartum
condition or complication, unspecified fetus
OB History

Gravidity:    3         Term:   1        Prem:   0        SAB:   1
TOP:          0       Ectopic:  0        Living: 1
Fetal Evaluation

Num Of Fetuses:     1
Fetal Heart         120
Rate(bpm):
Cardiac Activity:   Observed
Presentation:       Cephalic
Placenta:           Anterior
P. Cord Insertion:  Previously Visualized

Amniotic Fluid
AFI FV:      Polyhydramnios

AFI Sum(cm)     %Tile       Largest Pocket(cm)
45.91           > 97

RUQ(cm)       RLQ(cm)       LUQ(cm)        LLQ(cm)
14.61
Biophysical Evaluation

Amniotic F.V:   Polyhydramnios             F. Tone:        Observed
F. Movement:    Observed                   Score:          [DATE]
F. Breathing:   Observed
Gestational Age

LMP:           44w 5d        Date:  11/25/16                 EDD:   09/01/17
Best:          35w 4d     Det. By:  Early Ultrasound         EDD:   11/04/17
(03/24/17)
Anatomy

Thoracic:              Appears normal         Abdomen:                Appears normal
Heart:                 Appears normal         Kidneys:                Appear normal
(4CH, axis, and situs
Stomach:               Appears normal, left   Bladder:                Appears normal
sided
Cervix Uterus Adnexa

Cervix
Not visualized (advanced GA >06wks)
Impression

Polyhydramnios is seen again. Average AFI measurement
(45 and 33 cm) is 39 cm. Good fetal activity is seen.
Antenatal testing is reassuring. BPP [DATE].
shortness of breath. No symptoms of preterm labor.

Amnioreduction is not indicated at this gestational age in the
absence of severe maternal discomfort. I informed the patient
that polyhydramnios increases the risk of preterm
labor/PPROM. Cephalic presentation is associated with less-
likelihood of cord prolapse. I discussed labor precautions.
Recommendations

-Continue weekly antenatal testing till delivery.
-Delivery may be considered between 37 and 39 weeks  if
severe maternal discomfort arises.

## 2018-10-18 IMAGING — US US MFM OB FOLLOW-UP
1 series · 14 of 26 positions shown · non-contrast
Comparison: none

[Series 1: us mfm ob follow-up · 26 acquisitions, 14 frames shown]
[im 1/26]
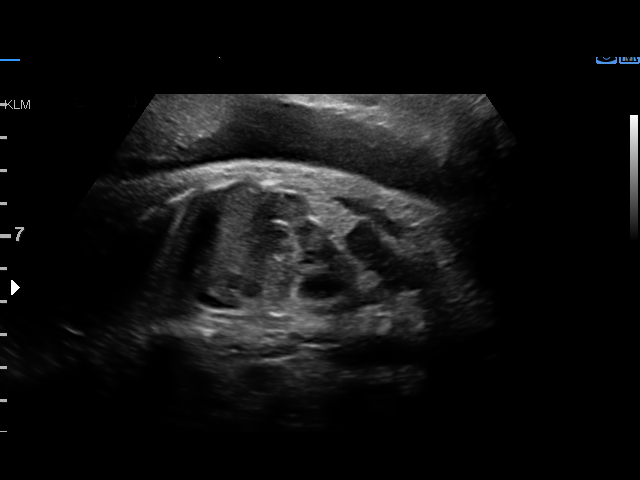
[im 3/26]
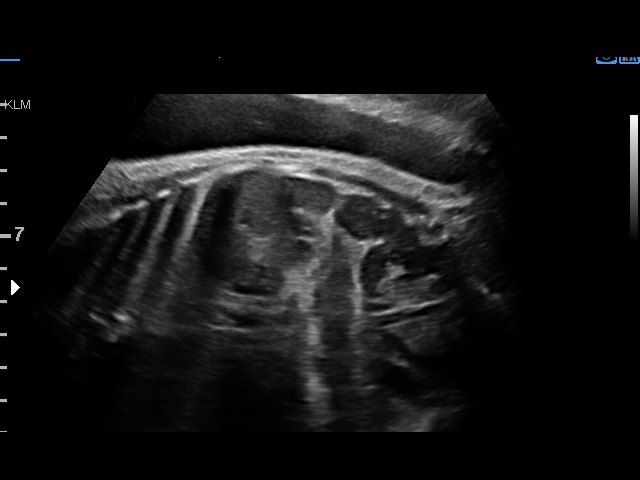
[im 5/26]
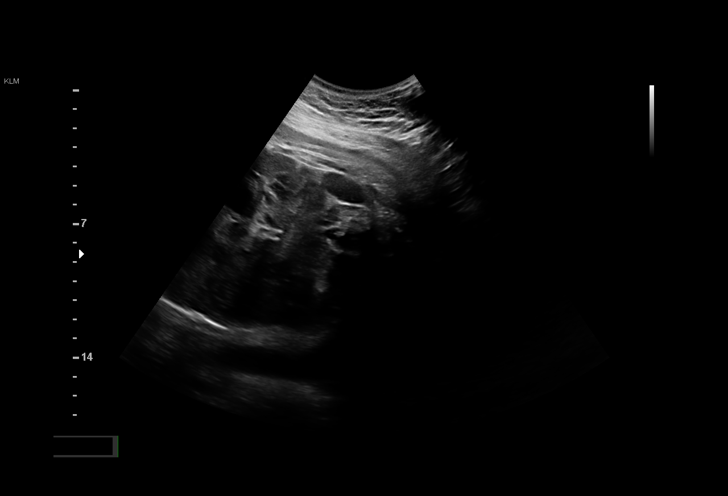
[im 7/26]
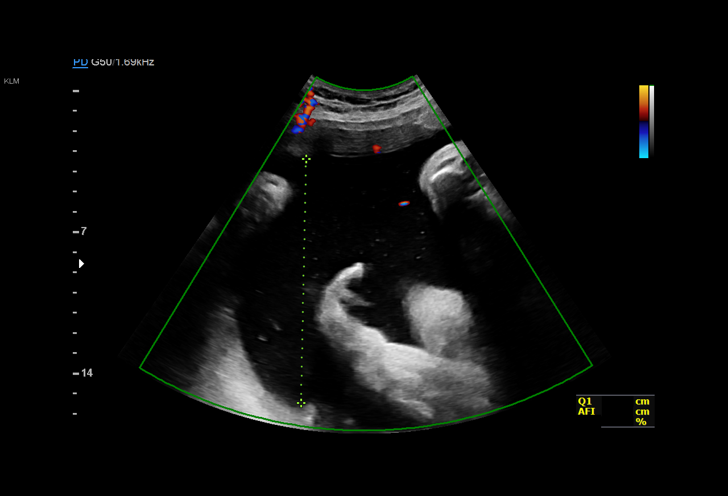
[im 9/26]
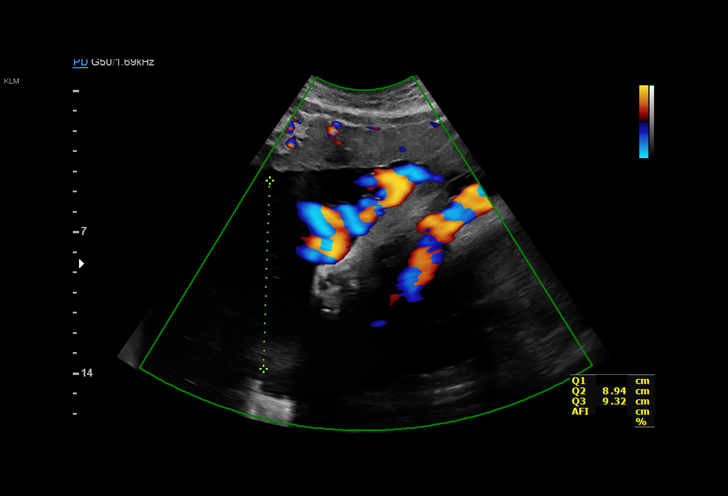
[im 11/26]
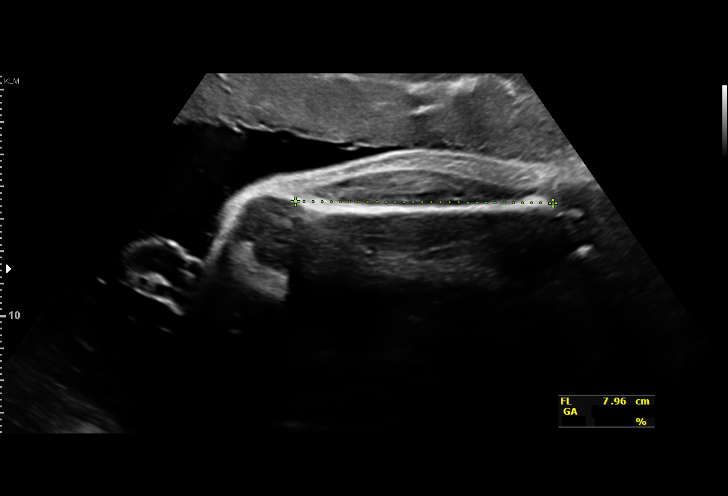
[im 13/26]
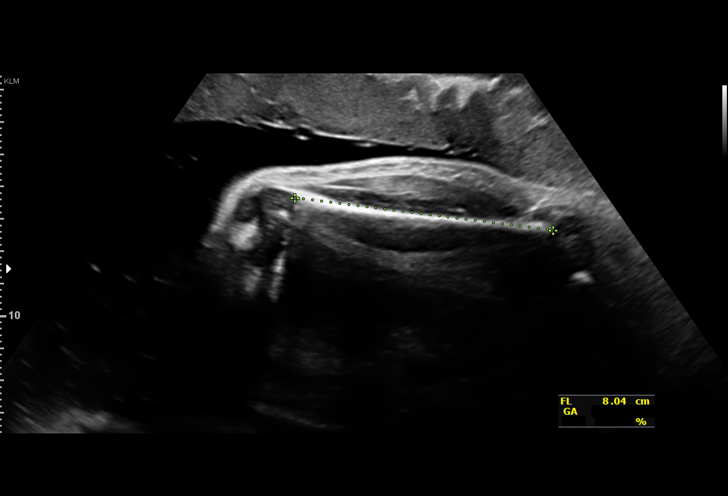
[im 14/26]
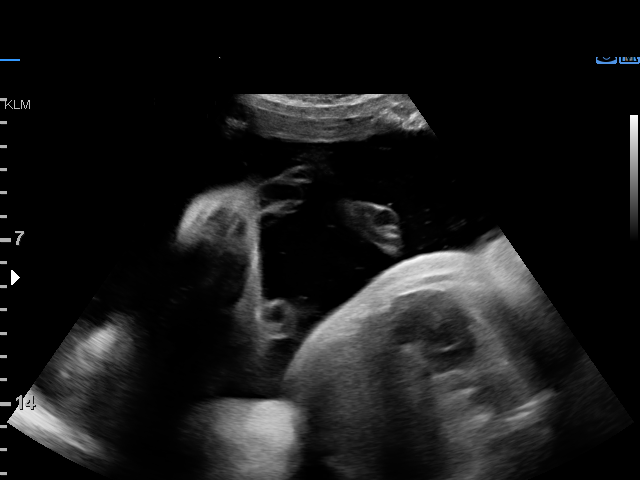
[im 16/26]
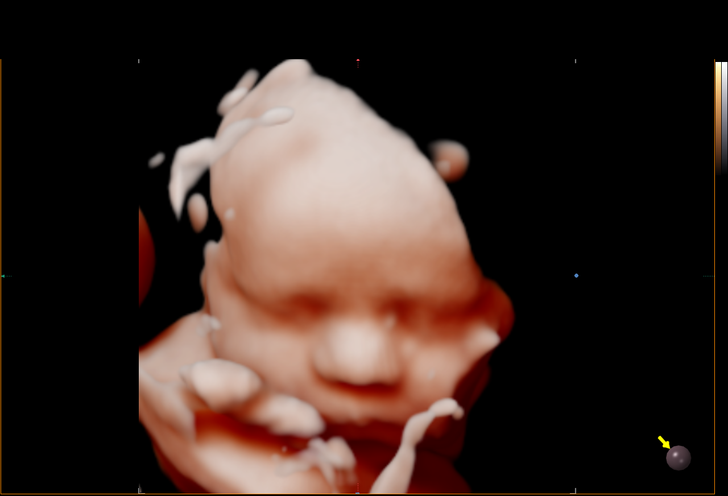
[im 18/26]
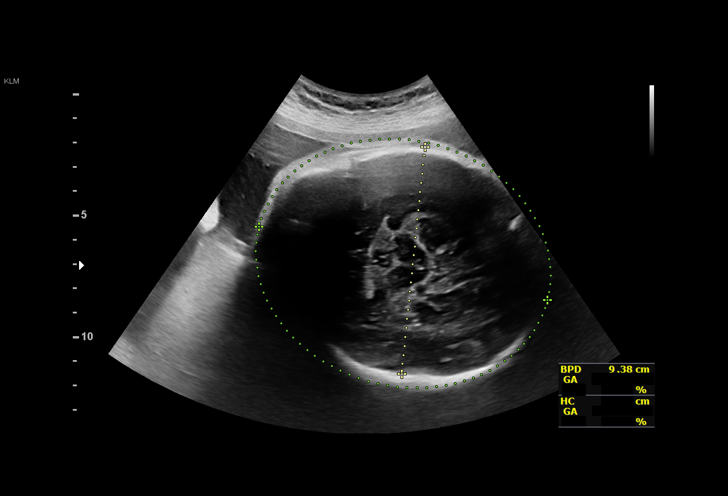
[im 20/26]
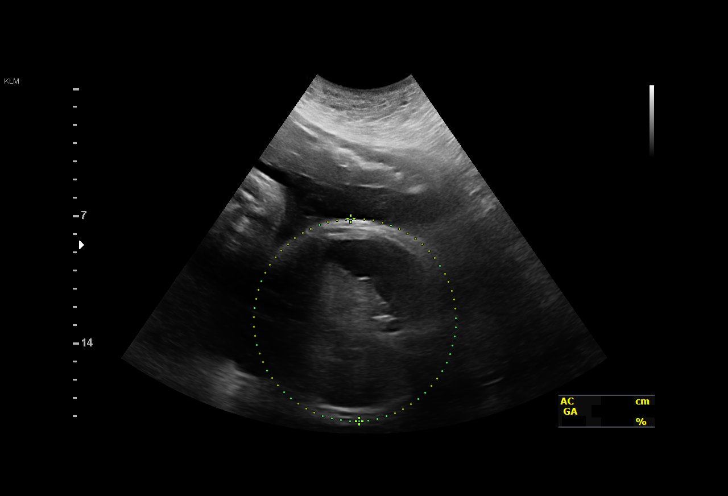
[im 22/26]
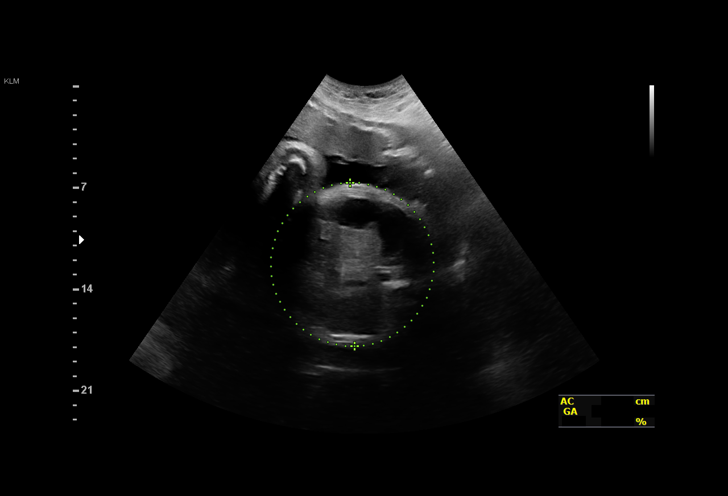
[im 24/26]
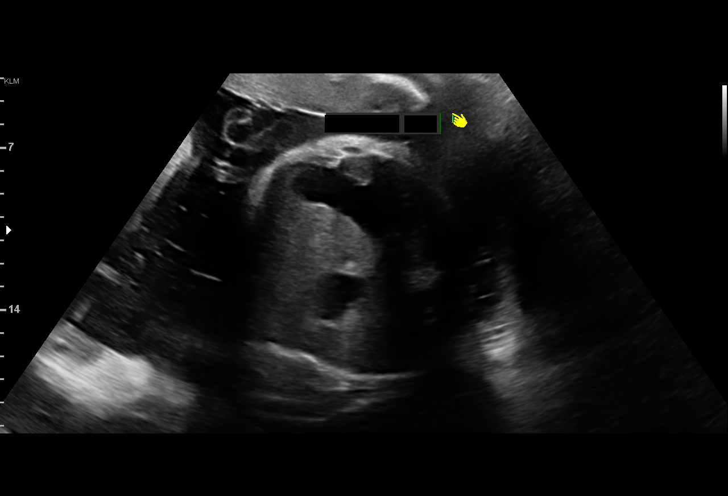
[im 26/26]
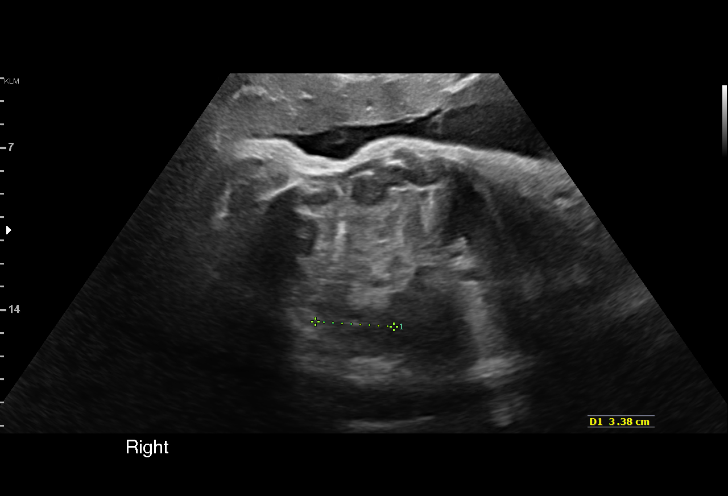

[14 of 26 positions shown; findings below may reference images not displayed]

for [REDACTED]care

Indications

Polyhydramnios, third trimester, antepartum
condition or complication, unspecified fetus
37 weeks gestation of pregnancy
Previous cesarean delivery, antepartum
Fetal Evaluation

Num Of Fetuses:         1
Fetal Heart Rate(bpm):  120
Cardiac Activity:       Observed
Presentation:           Breech
Placenta:               Anterior
P. Cord Insertion:      Previously Visualized

Amniotic Fluid
AFI FV:      Polyhydramnios

AFI Sum(cm)     %Tile       Largest Pocket(cm)
37.05           > 97

RUQ(cm)       RLQ(cm)       LUQ(cm)        LLQ(cm)
12.07

Comment:    [DATE] BPP
Biophysical Evaluation

Amniotic F.V:   Polyhydramnios             F. Tone:        Observed
F. Movement:    Observed                   Score:          [DATE]
F. Breathing:   Observed
Biometry

BPD:      95.4  mm     G. Age:  38w 6d         92  %    CI:        74.55   %    70 - 86
FL/HC:      22.8   %    20.9 -
HC:      350.7  mm     G. Age:  40w 6d         91  %    HC/AC:      0.99        0.92 -
AC:      355.5  mm     G. Age:  39w 3d         95  %    FL/BPD:     83.6   %    71 - 87
FL:       79.8  mm     G. Age:  40w 6d       > 97  %    FL/AC:      22.4   %    20 - 24

Est. FW:    1656  gm    8 lb 10 oz    > 90  %
OB History

Gravidity:    3         Term:   1        Prem:   0        SAB:   1
TOP:          0       Ectopic:  0        Living: 1
Gestational Age

LMP:           46w 6d        Date:  11/25/16                 EDD:   09/01/17
U/S Today:     40w 0d                                        EDD:   10/19/17
Best:          37w 5d     Det. By:  Early Ultrasound         EDD:   11/04/17
(03/24/17)
Anatomy

Cranium:               Appears normal         Aortic Arch:            Previously seen
Cavum:                 Previously seen        Ductal Arch:            Previously seen
Ventricles:            Previously seen        Diaphragm:              Appears normal
Choroid Plexus:        Previously seen        Stomach:                Appears normal, left
sided
Cerebellum:            Previously seen        Abdomen:                Appears normal
Posterior Fossa:       Previously seen        Abdominal Wall:         Previously seen
Nuchal Fold:           Previously seen        Cord Vessels:           Previously seen
Face:                  Orbits and profile     Kidneys:                Appear normal
previously seen
Lips:                  Previously seen        Bladder:                Appears normal
Thoracic:              Previously seen        Spine:                  Previously seen
Heart:                 Previously seen        Upper Extremities:      Previously seen
RVOT:                  Previously seen        Lower Extremities:      Previously seen
LVOT:                  Previously seen

Other:  Fetus appears to be a female. Heels and 5th digit previously
visualized. Nasal bone previously visualized.
Cervix Uterus Adnexa

Cervix
Not visualized (advanced GA >23wks)
Comments

U/S images reviewed. Findings reviewed with patient.
Accelerated fetal growth is noted.  No fetal abnormalities are
seen.  No evidence of fetal compromise is seen on BPP
today.  Moderate polyhydramnios is seen.
Questions answered.
10 minutes spent face to face.
Recommendations: 1) Weekly BPP 2) Delivery is scheduled
for 10/28/17
Recommendations

1) Weekly BPP 2) Delivery is scheduled for 10/28/17

## 2018-11-28 ENCOUNTER — Encounter

## 2019-05-02 DIAGNOSIS — M542 Cervicalgia: Secondary | ICD-10-CM | POA: Diagnosis not present

## 2019-05-02 DIAGNOSIS — M62838 Other muscle spasm: Secondary | ICD-10-CM | POA: Diagnosis not present

## 2019-05-02 DIAGNOSIS — M9902 Segmental and somatic dysfunction of thoracic region: Secondary | ICD-10-CM | POA: Diagnosis not present

## 2019-05-02 DIAGNOSIS — M9901 Segmental and somatic dysfunction of cervical region: Secondary | ICD-10-CM | POA: Diagnosis not present

## 2019-05-10 DIAGNOSIS — M542 Cervicalgia: Secondary | ICD-10-CM | POA: Diagnosis not present

## 2019-05-10 DIAGNOSIS — M62838 Other muscle spasm: Secondary | ICD-10-CM | POA: Diagnosis not present

## 2019-05-10 DIAGNOSIS — M9901 Segmental and somatic dysfunction of cervical region: Secondary | ICD-10-CM | POA: Diagnosis not present

## 2019-05-10 DIAGNOSIS — M9902 Segmental and somatic dysfunction of thoracic region: Secondary | ICD-10-CM | POA: Diagnosis not present

## 2019-05-12 DIAGNOSIS — M542 Cervicalgia: Secondary | ICD-10-CM | POA: Diagnosis not present

## 2019-05-12 DIAGNOSIS — M9901 Segmental and somatic dysfunction of cervical region: Secondary | ICD-10-CM | POA: Diagnosis not present

## 2019-05-12 DIAGNOSIS — M62838 Other muscle spasm: Secondary | ICD-10-CM | POA: Diagnosis not present

## 2019-05-12 DIAGNOSIS — M9902 Segmental and somatic dysfunction of thoracic region: Secondary | ICD-10-CM | POA: Diagnosis not present

## 2019-05-15 DIAGNOSIS — M542 Cervicalgia: Secondary | ICD-10-CM | POA: Diagnosis not present

## 2019-05-15 DIAGNOSIS — M62838 Other muscle spasm: Secondary | ICD-10-CM | POA: Diagnosis not present

## 2019-05-15 DIAGNOSIS — M9902 Segmental and somatic dysfunction of thoracic region: Secondary | ICD-10-CM | POA: Diagnosis not present

## 2019-05-15 DIAGNOSIS — M9901 Segmental and somatic dysfunction of cervical region: Secondary | ICD-10-CM | POA: Diagnosis not present

## 2019-05-17 DIAGNOSIS — M542 Cervicalgia: Secondary | ICD-10-CM | POA: Diagnosis not present

## 2019-05-17 DIAGNOSIS — M9902 Segmental and somatic dysfunction of thoracic region: Secondary | ICD-10-CM | POA: Diagnosis not present

## 2019-05-17 DIAGNOSIS — M9901 Segmental and somatic dysfunction of cervical region: Secondary | ICD-10-CM | POA: Diagnosis not present

## 2019-05-17 DIAGNOSIS — M62838 Other muscle spasm: Secondary | ICD-10-CM | POA: Diagnosis not present

## 2019-05-22 DIAGNOSIS — M9901 Segmental and somatic dysfunction of cervical region: Secondary | ICD-10-CM | POA: Diagnosis not present

## 2019-05-22 DIAGNOSIS — M9902 Segmental and somatic dysfunction of thoracic region: Secondary | ICD-10-CM | POA: Diagnosis not present

## 2019-05-22 DIAGNOSIS — M542 Cervicalgia: Secondary | ICD-10-CM | POA: Diagnosis not present

## 2019-05-22 DIAGNOSIS — M62838 Other muscle spasm: Secondary | ICD-10-CM | POA: Diagnosis not present

## 2019-05-24 DIAGNOSIS — M9901 Segmental and somatic dysfunction of cervical region: Secondary | ICD-10-CM | POA: Diagnosis not present

## 2019-05-24 DIAGNOSIS — M62838 Other muscle spasm: Secondary | ICD-10-CM | POA: Diagnosis not present

## 2019-05-24 DIAGNOSIS — M542 Cervicalgia: Secondary | ICD-10-CM | POA: Diagnosis not present

## 2019-05-24 DIAGNOSIS — M9902 Segmental and somatic dysfunction of thoracic region: Secondary | ICD-10-CM | POA: Diagnosis not present

## 2019-05-29 DIAGNOSIS — M542 Cervicalgia: Secondary | ICD-10-CM | POA: Diagnosis not present

## 2019-05-29 DIAGNOSIS — M9902 Segmental and somatic dysfunction of thoracic region: Secondary | ICD-10-CM | POA: Diagnosis not present

## 2019-05-29 DIAGNOSIS — M9901 Segmental and somatic dysfunction of cervical region: Secondary | ICD-10-CM | POA: Diagnosis not present

## 2019-05-29 DIAGNOSIS — M62838 Other muscle spasm: Secondary | ICD-10-CM | POA: Diagnosis not present

## 2019-05-31 DIAGNOSIS — M9902 Segmental and somatic dysfunction of thoracic region: Secondary | ICD-10-CM | POA: Diagnosis not present

## 2019-05-31 DIAGNOSIS — M9901 Segmental and somatic dysfunction of cervical region: Secondary | ICD-10-CM | POA: Diagnosis not present

## 2019-05-31 DIAGNOSIS — M62838 Other muscle spasm: Secondary | ICD-10-CM | POA: Diagnosis not present

## 2019-05-31 DIAGNOSIS — M542 Cervicalgia: Secondary | ICD-10-CM | POA: Diagnosis not present

## 2019-06-05 DIAGNOSIS — M9901 Segmental and somatic dysfunction of cervical region: Secondary | ICD-10-CM | POA: Diagnosis not present

## 2019-06-05 DIAGNOSIS — M62830 Muscle spasm of back: Secondary | ICD-10-CM | POA: Diagnosis not present

## 2019-06-05 DIAGNOSIS — M9902 Segmental and somatic dysfunction of thoracic region: Secondary | ICD-10-CM | POA: Diagnosis not present

## 2019-06-05 DIAGNOSIS — M62838 Other muscle spasm: Secondary | ICD-10-CM | POA: Diagnosis not present

## 2019-06-05 DIAGNOSIS — M542 Cervicalgia: Secondary | ICD-10-CM | POA: Diagnosis not present

## 2019-07-16 DIAGNOSIS — J019 Acute sinusitis, unspecified: Secondary | ICD-10-CM | POA: Diagnosis not present

## 2019-07-16 DIAGNOSIS — B9689 Other specified bacterial agents as the cause of diseases classified elsewhere: Secondary | ICD-10-CM | POA: Diagnosis not present

## 2019-07-24 DIAGNOSIS — Z13220 Encounter for screening for lipoid disorders: Secondary | ICD-10-CM | POA: Diagnosis not present

## 2019-07-24 DIAGNOSIS — Z0000 Encounter for general adult medical examination without abnormal findings: Secondary | ICD-10-CM | POA: Diagnosis not present

## 2019-07-24 DIAGNOSIS — E538 Deficiency of other specified B group vitamins: Secondary | ICD-10-CM | POA: Diagnosis not present

## 2019-07-24 DIAGNOSIS — E559 Vitamin D deficiency, unspecified: Secondary | ICD-10-CM | POA: Diagnosis not present

## 2019-07-24 DIAGNOSIS — R5383 Other fatigue: Secondary | ICD-10-CM | POA: Diagnosis not present

## 2019-08-28 ENCOUNTER — Other Ambulatory Visit (INDEPENDENT_AMBULATORY_CARE_PROVIDER_SITE_OTHER): Payer: BLUE CROSS/BLUE SHIELD | Admitting: *Deleted

## 2019-08-28 ENCOUNTER — Other Ambulatory Visit: Payer: Self-pay

## 2019-08-28 DIAGNOSIS — N912 Amenorrhea, unspecified: Secondary | ICD-10-CM | POA: Diagnosis not present

## 2019-08-28 NOTE — Progress Notes (Signed)
Pt here for BHCG.  Her LMP was 07/18/19.  She has done 2 UPT's at home that were faintly positive.  She is going OOT this weekend and would like to know if she could be pregnant.

## 2019-08-29 LAB — HCG, QUANTITATIVE, PREGNANCY: HCG, Total, QN: <3 m[IU]/mL

## 2019-10-01 DIAGNOSIS — R300 Dysuria: Secondary | ICD-10-CM | POA: Diagnosis not present

## 2019-10-16 ENCOUNTER — Telehealth: Payer: Self-pay | Admitting: *Deleted

## 2019-10-16 MED ORDER — VALACYCLOVIR HCL 1 G PO TABS: 1000.0000 mg | ORAL_TABLET | Freq: Two times a day (BID) | ORAL | 11 refills | Status: DC

## 2019-10-16 MED ORDER — VALACYCLOVIR HCL 1 G PO TABS: 500.0000 mg | ORAL_TABLET | Freq: Two times a day (BID) | ORAL | 11 refills | Status: DC

## 2019-10-16 NOTE — Telephone Encounter (Signed)
Pt called requesting a RF on her Valtrex 1000mg .  She does have a H/O HSV.  RF sent to [facility] .

## 2019-10-16 NOTE — Telephone Encounter (Signed)
Pt called back to office and wanted her Valtrex switched to HT in [place] due to cost.  Med sent to new pharmacy

## 2020-02-27 ENCOUNTER — Ambulatory Visit: Payer: Self-pay

## 2023-07-14 ENCOUNTER — Telehealth: Payer: Self-pay | Admitting: *Deleted

## 2023-07-14 LAB — OB RESULTS CONSOLE ABO/RH: RH Type: POSITIVE

## 2023-07-14 LAB — OB RESULTS CONSOLE HEPATITIS B SURFACE ANTIGEN: Hepatitis B Surface Ag: NEGATIVE

## 2023-07-14 LAB — HEPATITIS C ANTIBODY: HCV Ab: NEGATIVE

## 2023-07-14 LAB — OB RESULTS CONSOLE RUBELLA ANTIBODY, IGM: Rubella: IMMUNE

## 2023-07-14 LAB — OB RESULTS CONSOLE GC/CHLAMYDIA
Chlamydia: NEGATIVE
Neisseria Gonorrhea: NEGATIVE

## 2023-07-14 LAB — OB RESULTS CONSOLE HIV ANTIBODY (ROUTINE TESTING): HIV: NONREACTIVE

## 2023-07-14 LAB — OB RESULTS CONSOLE ANTIBODY SCREEN: Antibody Screen: NEGATIVE

## 2023-07-14 LAB — PAP IG AND HPV HIGH-RISK: HPV Aptima: NEGATIVE

## 2023-07-14 LAB — OB RESULTS CONSOLE HGB/HCT, BLOOD: Hemoglobin: 13.7

## 2023-07-14 LAB — OB RESULTS CONSOLE RPR: RPR: NONREACTIVE

## 2023-07-14 LAB — OB RESULTS CONSOLE PLATELET COUNT: Platelets: 235

## 2023-07-14 NOTE — Telephone Encounter (Signed)
 Returned call from 3:33 PM. Left patient a message that the office will call her back tomorrow to schedule New OB appointment.

## 2023-08-18 ENCOUNTER — Ambulatory Visit: Payer: Self-pay | Admitting: Obstetrics and Gynecology

## 2023-08-18 ENCOUNTER — Encounter: Payer: Self-pay | Admitting: Obstetrics and Gynecology

## 2023-08-18 VITALS — BP 111/78 | HR 99 | Wt 155.0 lb

## 2023-08-18 DIAGNOSIS — Z3490 Encounter for supervision of normal pregnancy, unspecified, unspecified trimester: Secondary | ICD-10-CM | POA: Insufficient documentation

## 2023-08-18 DIAGNOSIS — Z8619 Personal history of other infectious and parasitic diseases: Secondary | ICD-10-CM | POA: Diagnosis not present

## 2023-08-18 DIAGNOSIS — O09522 Supervision of elderly multigravida, second trimester: Secondary | ICD-10-CM

## 2023-08-18 DIAGNOSIS — F411 Generalized anxiety disorder: Secondary | ICD-10-CM | POA: Diagnosis not present

## 2023-08-18 DIAGNOSIS — Z1332 Encounter for screening for maternal depression: Secondary | ICD-10-CM | POA: Diagnosis not present

## 2023-08-18 DIAGNOSIS — Z3480 Encounter for supervision of other normal pregnancy, unspecified trimester: Secondary | ICD-10-CM

## 2023-08-18 DIAGNOSIS — Z3A14 14 weeks gestation of pregnancy: Secondary | ICD-10-CM | POA: Diagnosis not present

## 2023-08-18 DIAGNOSIS — O09529 Supervision of elderly multigravida, unspecified trimester: Secondary | ICD-10-CM | POA: Insufficient documentation

## 2023-08-18 DIAGNOSIS — Z98891 History of uterine scar from previous surgery: Secondary | ICD-10-CM

## 2023-08-18 NOTE — Addendum Note (Signed)
 Addended by: ERIK FELTS on: 08/18/2023 09:14 AM   Modules accepted: Orders

## 2023-08-18 NOTE — Progress Notes (Signed)
 Subjective:   Savannah Wade is a 33 y.o. [id number] at [redacted]w[redacted]d by early ultrasound (6w) being seen today for her first obstetrical visit.  Her obstetrical history is significant for advanced maternal age and prior CS x2. Patient does intend to breast feed. Pregnancy history fully reviewed.  Patient reports no complaints.  HISTORY: OB History  Gravida Para Term Preterm AB Living  6 2 2  0 3 2  SAB IAB Ectopic Multiple Live Births  1 0 0 0 2    # Outcome Date GA Lbr Len/2nd Weight Sex Type Anes PTL Lv  6 Current           5 AB 02/07/23     SAB     4 Term 10/28/17 [redacted]w[redacted]d  7 lb 9 oz (3.43 kg) F CS-Vac Spinal  LIV     Name: Savannah Wade     Apgar1: 7  Apgar5: 9  3 AB 2018     SAB     2 Term 09/09/15 [redacted]w[redacted]d  7 lb 6 oz (3.345 kg) F CS-LTranv   LIV     Complications: Cephalopelvic Disproportion  1 SAB 2016     SAB        Last pap smear: NILM/HPV neg 07/14/23  Past Medical History:  Diagnosis Date   Vaginal Pap smear, abnormal    Past Surgical History:  Procedure Laterality Date   CESAREAN SECTION N/A 09/09/2015   Procedure: CESAREAN SECTION;  Surgeon: Savannah LULLA Server, MD;  Location: [facility];  Service: Obstetrics;  Laterality: N/A;   CESAREAN SECTION N/A 10/28/2017   Procedure: REPEAT CESAREAN SECTION;  Surgeon: Savannah Ozell CROME, MD;  Location: [facility];  Service: Obstetrics;  Laterality: N/A;   WISDOM TOOTH EXTRACTION     Family History  Problem Relation Age of Onset   Cancer Father        testicular   Diabetes Paternal Grandmother    Heart attack Paternal Grandmother        bypass surgery   Hypertension Paternal Grandmother    Cancer - Colon Paternal Grandfather        colon   Cancer Paternal Grandfather        prostate   COPD Paternal Grandfather    Heart attack Maternal Grandfather 50   Hyperlipidemia Maternal Grandfather    Heart attack Maternal Grandmother        x 2   Coronary artery disease Maternal Grandmother    Social History   Tobacco  Use   Smoking status: Former    Types: Cigarettes   Smokeless tobacco: Never  Vaping Use   Vaping status: Never Used  Substance Use Topics   Alcohol use: Yes    Alcohol/week: 0.0 standard drinks of alcohol    Comment: occasionally   Drug use: No   Allergies  Allergen Reactions   Sulfa Antibiotics Hives   Current Outpatient Medications on File Prior to Visit  Medication Sig Dispense Refill   MAGNESIUM PO Take by mouth daily.     Prenatal Multivit-Min-Fe-FA (PRENATAL VITAMINS PO) Take 1 tablet by mouth daily.      progesterone (PROMETRIUM) 100 MG capsule Take 100 mg by mouth daily.     sertraline (ZOLOFT) 25 MG tablet Take 25 mg by mouth daily.     valACYclovir  (VALTREX ) 1000 MG tablet Take 0.5 tablets (500 mg total) by mouth 2 (two) times daily. 30 tablet 11   No current facility-administered medications on file prior to visit.  Exam   Vitals:   08/18/23 0823  BP: 111/78  Pulse: 99  Weight: 155 lb (70.3 kg)   Fetal Heart Rate (bpm): 145  General:  Alert, oriented and cooperative. Patient is in no acute distress.  Breast: Deferred  Cardiovascular: Normal heart rate noted  Respiratory: Normal respiratory effort, no problems with respiration noted  Extremities: Normal range of motion.  Edema: None  Mental Status: Normal mood and affect. Normal behavior. Normal judgment and thought content.    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 Supervision of other normal pregnancy, antepartum (Primary) 2. [redacted] weeks gestation of pregnancy Initial labs reviewed and normal. Continue prenatal vitamins. Discussed that she can stop supplemental progesterone but continuing will not harm pregnancy  either. She plans to stop. Genetic Screening discussed: NIPS, carrier screening and AFP, ordered. Ultrasound discussed; fetal anatomic survey: ordered. Problem list reviewed and updated. The nature of [facility] with multiple MDs and other Advanced Practice Providers was explained to patient; also emphasized that residents, students are part of our team. Routine obstetric precautions reviewed. - US  MFM OB COMP + 14 WK; Future - PANORAMA PRENATAL TEST - HORIZON Basic Panel - Hemoglobin A1c  3. H/O cesarean section Op notes available, both at [facility] G1 FTP at 2cm, G2 schedule repeat Unsure MOD, but leaning towards repeat CS  4. History of herpes genitalis Suppression at 35-36weeks  5. Generalized anxiety disorder Will send picture of medication to confirm it is sertraline Reports anxiety is much better now that she is past first trimester so she is considering stopping her medication  6. Multigravida of advanced maternal age in second trimester  Return in about 4 weeks (around 09/15/2023) for return OB at 18 weeks.  Kieth Carolin, MD Obstetrician & Gynecologist, [facility] for [employer], [facility]

## 2023-08-18 NOTE — Patient Instructions (Signed)
 It was nice meeting you today! You will see your results in the MyChart app within 1 week. Thank you for choosing us  to care for you & your family.

## 2023-08-19 ENCOUNTER — Ambulatory Visit: Payer: Self-pay | Admitting: Obstetrics and Gynecology

## 2023-08-19 DIAGNOSIS — Z3490 Encounter for supervision of normal pregnancy, unspecified, unspecified trimester: Secondary | ICD-10-CM

## 2023-08-19 LAB — HEMOGLOBIN A1C
Est. average glucose Bld gHb Est-mCnc: 94 mg/dL
Hgb A1c MFr Bld: 4.9 % (ref 4.8–5.6)

## 2023-09-01 ENCOUNTER — Other Ambulatory Visit: Payer: Self-pay

## 2023-09-01 MED ORDER — PROGESTERONE MICRONIZED 100 MG PO CAPS: 100.0000 mg | ORAL_CAPSULE | Freq: Every day | ORAL | 0 refills | Status: DC

## 2023-09-03 ENCOUNTER — Telehealth: Payer: Self-pay

## 2023-09-03 NOTE — Telephone Encounter (Signed)
 RN received message on nurse line with patient reports of complaints of cough and congestion[place]. RN attempted to return patient call, left HIPAA compliant voicemail to return RN call, safe otc medications list sent to patient via mychart with note to call PCP regarding symptoms.  Silvano LELON Piano, RN

## 2023-09-06 ENCOUNTER — Telehealth: Payer: Self-pay

## 2023-09-06 ENCOUNTER — Ambulatory Visit (INDEPENDENT_AMBULATORY_CARE_PROVIDER_SITE_OTHER): Admitting: Obstetrics & Gynecology

## 2023-09-06 VITALS — BP 94/61 | HR 88 | Wt 159.0 lb

## 2023-09-06 DIAGNOSIS — Z3480 Encounter for supervision of other normal pregnancy, unspecified trimester: Secondary | ICD-10-CM | POA: Diagnosis not present

## 2023-09-06 DIAGNOSIS — J069 Acute upper respiratory infection, unspecified: Secondary | ICD-10-CM | POA: Diagnosis not present

## 2023-09-06 NOTE — Progress Notes (Signed)
   PRENATAL VISIT NOTE  Subjective:  Savannah Wade is a 33 y.o. H3E7967 at [redacted]w[redacted]d being seen today for ongoing prenatal care.  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  Patient reports coughing, difficulty sleeping; no fevers, no runny nose.  Contractions: Not present. Vag. Bleeding: None.  Movement: Present. Denies leaking of fluid.   The following portions of the patient's history were reviewed and updated as appropriate: allergies, current medications, past family history, past medical history, past social history, past surgical history and problem list.   Objective:    Vitals:   09/06/23 1541  BP: 94/61  Pulse: 88  Weight: 159 lb 0.6 oz (72.1 kg)    Fetal Status:  Fetal Heart Rate (bpm): 154   Movement: Present    General: Alert, oriented and cooperative. Patient is in no acute distress.  TM clear, throat clear  Skin: Skin is warm and dry. No rash noted.   Cardiovascular: Normal heart rate noted  Respiratory: Normal respiratory effort, no problems with respiration noted, CTAB  Abdomen: Soft, gravid, appropriate for gestational age.  Pain/Pressure: Absent     Pelvic: Cervical exam deferred        Extremities: Normal range of motion.  Edema: None  Mental Status: Normal mood and affect. Normal behavior. Normal judgment and thought content.   Assessment and Plan:  Pregnancy: [id number] at [redacted]w[redacted]d 1. Supervision of other normal pregnancy, antepartum (Primary) Anatomy US  in 2 weeks  2.  URI Flu and covid negative Feeling a little better each day Sick contact daughter -- antibiotics did not help Tylenol , robitussin, claritin, water and rest  3.  Keep US  appt and routine OB appt.   Preterm labor symptoms and general obstetric  precautions including but not limited to vaginal bleeding, contractions, leaking of fluid and fetal movement were reviewed in detail with the patient. Please refer to After Visit Summary for other counseling recommendations.   No follow-ups on file.  Future Appointments  Date Time Provider Department Center  09/15/2023  3:30 PM Erik Kieth BROCKS, MD [facility]  09/22/2023 10:00 AM [facility] PROVIDER 1 [facility]  09/22/2023 10:30 AM [facility] US5 [facility]  10/13/2023 10:10 AM Erik Kieth BROCKS, MD [facility]    Burnard Pate, MD

## 2023-09-06 NOTE — Telephone Encounter (Signed)
 2nd attempt to return patient call regarding complaints of cough for RN to triage, left HIPAA compliant voicemail to return RN call.  Silvano LELON Piano, RN

## 2023-09-07 ENCOUNTER — Ambulatory Visit
Admission: EM | Admit: 2023-09-07 | Discharge: 2023-09-07 | Disposition: A | Discharge status: Home / Self Care | Attending: Family Medicine | Admitting: Family Medicine

## 2023-09-07 DIAGNOSIS — L509 Urticaria, unspecified: Secondary | ICD-10-CM | POA: Diagnosis not present

## 2023-09-07 MED ORDER — FEXOFENADINE HCL 180 MG PO TABS: 180.0000 mg | ORAL_TABLET | Freq: Every day | ORAL | 0 refills | Status: DC

## 2023-09-07 NOTE — Discharge Instructions (Addendum)
 Advised patient take medication with food as directed.  Advised may take Allegra  daily or as needed for acute urticaria,/rash/hives.  Encouraged increase daily water intake to 64 ounces per day while taking this medication.  Advised if symptoms worsen and/or unresolved please follow-up with your OB or here for further evaluation.

## 2023-09-07 NOTE — ED Provider Notes (Signed)
 [facility]    CSN: [id number] Arrival date & time: 09/07/23  9077      History   Chief Complaint Chief Complaint  Patient presents with   Rash    HPI Savannah Wade is a 33 y.o. female.   HPI 33 year old female presents with cough since last week and was told by GYN to try Robitussin.  Patient reports tried Robitussin last night and broke into hives.  Patient is accompanied by her mother this morning.  Patient is [redacted] weeks pregnant with an estimated due date of 02/16/2024.  Past Medical History:  Diagnosis Date   Vaginal Pap smear, abnormal     Patient Active Problem List   Diagnosis Date Noted   Encounter for supervision of normal pregnancy, antepartum 08/18/2023   AMA (advanced maternal age) multigravida 35+ 08/18/2023   H/O cesarean section 10/16/2015   Genital herpes affecting pregnancy in second trimester 09/02/2015    Past Surgical History:  Procedure Laterality Date   CESAREAN SECTION N/A 09/09/2015   Procedure: CESAREAN SECTION;  Surgeon: Norleen LULLA Server, MD;  Location: [facility] BIRTHING SUITES;  Service: Obstetrics;  Laterality: N/A;   CESAREAN SECTION N/A 10/28/2017   Procedure: REPEAT CESAREAN SECTION;  Surgeon: Lorence Ozell CROME, MD;  Location: [facility] BIRTHING SUITES;  Service: Obstetrics;  Laterality: N/A;   WISDOM TOOTH EXTRACTION      OB History     Gravida  6   Para  2   Term  2   Preterm      AB  3   Living  2      SAB  1   IAB      Ectopic      Multiple  0   Live Births  2            Home Medications    Prior to Admission medications   Medication Sig Start Date End Date Taking? Authorizing Provider  fexofenadine  (ALLEGRA  ALLERGY) 180 MG tablet Take 1 tablet (180 mg total) by mouth daily for 15 days. 09/07/23 09/22/23 Yes Teddy Ozell, FNP  MAGNESIUM PO Take by mouth daily.    [provider]  Prenatal Multivit-Min-Fe-FA (PRENATAL VITAMINS PO) Take 1 tablet by mouth daily.     [provider]  progesterone   (PROMETRIUM ) 100 MG capsule Take 1 capsule (100 mg total) by mouth daily. 09/01/23   Erik Kieth BROCKS, MD  sertraline (ZOLOFT) 25 MG tablet Take 25 mg by mouth daily. Patient not taking: Reported on 09/06/2023    [provider]  valACYclovir  (VALTREX ) 1000 MG tablet Take 0.5 tablets (500 mg total) by mouth 2 (two) times daily. 10/16/19   Leftwich-Kirby, Olam LABOR, CNM    Family History Family History  Problem Relation Age of Onset   Cancer Father        testicular   Diabetes Paternal Grandmother    Heart attack Paternal Grandmother        bypass surgery   Hypertension Paternal Grandmother    Cancer - Colon Paternal Grandfather        colon   Cancer Paternal Grandfather        prostate   COPD Paternal Grandfather    Heart attack Maternal Grandfather 50   Hyperlipidemia Maternal Grandfather    Heart attack Maternal Grandmother        x 2   Coronary artery disease Maternal Grandmother     Social History Social History   Tobacco Use   Smoking status: Former  Types: Cigarettes   Smokeless tobacco: Never  Vaping Use   Vaping status: Never Used  Substance Use Topics   Alcohol use: Yes    Alcohol/week: 0.0 standard drinks of alcohol    Comment: occasionally   Drug use: No     Allergies   Sulfa antibiotics   Review of Systems Review of Systems  Skin:  Positive for rash.  All other systems reviewed and are negative.    Physical Exam Triage Vital Signs ED Triage Vitals  Encounter Vitals Group     BP 09/07/23 0943 102/70     Girls Systolic BP Percentile --      Girls Diastolic BP Percentile --      Boys Systolic BP Percentile --      Boys Diastolic BP Percentile --      Pulse Rate 09/07/23 0943 86     Resp 09/07/23 0943 19     Temp 09/07/23 0943 (!) 97.5 F (36.4 C)     Temp src --      SpO2 09/07/23 0943 98 %     Weight --      Height --      Head Circumference --      Peak Flow --      Pain Score 09/07/23 0942 0     Pain Loc --      Pain  Education --      Exclude from Growth Chart --    No data found.  Updated Vital Signs BP 102/70   Pulse 86   Temp (!) 97.5 F (36.4 C)   Resp 19   SpO2 98%    Physical Exam Vitals and nursing note reviewed.  Constitutional:      Appearance: Normal appearance. She is normal weight.  HENT:     Head: Normocephalic and atraumatic.     Mouth/Throat:     Mouth: Mucous membranes are moist.     Pharynx: Oropharynx is clear.  Eyes:     Extraocular Movements: Extraocular movements intact.     Conjunctiva/sclera: Conjunctivae normal.     Pupils: Pupils are equal, round, and reactive to light.  Cardiovascular:     Rate and Rhythm: Normal rate and regular rhythm.     Pulses: Normal pulses.     Heart sounds: Normal heart sounds.  Pulmonary:     Effort: Pulmonary effort is normal.     Breath sounds: Normal breath sounds. No wheezing, rhonchi or rales.  Musculoskeletal:        General: Normal range of motion.     Cervical back: Normal range of motion and neck supple.  Skin:    General: Skin is warm and dry.     Comments: Right thigh (superior medial aspect): Erythematous, raised well annular borders resembling urticaria please see image below  Neurological:     General: No focal deficit present.     Mental Status: She is alert and oriented to person, place, and time. Mental status is at baseline.  Psychiatric:        Mood and Affect: Mood normal.        Behavior: Behavior normal.      UC Treatments / Results  Labs (all labs ordered are listed, but only abnormal results are displayed) Labs Reviewed - No data to display  EKG   Radiology No results found.  Procedures Procedures (including critical care time)  Medications Ordered in UC Medications - No data to display  Initial Impression / Assessment and Plan /  UC Course  I have reviewed the triage vital signs and the nursing notes.  Pertinent labs & imaging results that were available during my care of the patient  were reviewed by me and considered in my medical decision making (see chart for details).     MDM: 1.  Urticaria-Rx'd Allegra  180 mg tablet: Take 1 tablet daily, as needed for urticaria rash. Advised patient take medication with food as directed.  Advised may take Allegra  daily or as needed for acute urticaria,/rash/hives.  Encouraged increase daily water intake to 64 ounces per day while taking this medication.  Advised if symptoms worsen and/or unresolved please follow-up with your OB or here for further evaluation.  Patient discharged home, hemodynamically stable. Final Clinical Impressions(s) / UC Diagnoses   Final diagnoses:  Urticaria     Discharge Instructions      Advised patient take medication with food as directed.  Advised may take Allegra  daily or as needed for acute urticaria,/rash/hives.  Encouraged increase daily water intake to 64 ounces per day while taking this medication.  Advised if symptoms worsen and/or unresolved please follow-up with your OB or here for further evaluation.     ED Prescriptions     Medication Sig Dispense Auth. Provider   fexofenadine  (ALLEGRA  ALLERGY) 180 MG tablet Take 1 tablet (180 mg total) by mouth daily for 15 days. 15 tablet Trestin Vences, FNP      PDMP not reviewed this encounter.   Teddy Sharper, FNP 09/07/23 1052

## 2023-09-07 NOTE — ED Triage Notes (Addendum)
 Pt presents to uc with co cough since last week and she was told by her GYN to try robitussin. Pt reports she tried it last night and she broke up out in hives all over. No otc for allergic reaction. Pt is 17 weeks preg tomorrow EDD 12/31

## 2023-09-15 ENCOUNTER — Encounter: Admitting: Obstetrics and Gynecology

## 2023-09-20 ENCOUNTER — Telehealth: Payer: Self-pay

## 2023-09-20 NOTE — Telephone Encounter (Signed)
 RN received patient message on nurse line regarding dry cough. RN attempted to call patient back, left HIPAA compliant voicemail to return RN call.  Silvano LELON Piano, RN

## 2023-09-22 ENCOUNTER — Other Ambulatory Visit: Payer: Self-pay | Admitting: *Deleted

## 2023-09-22 ENCOUNTER — Ambulatory Visit

## 2023-09-22 ENCOUNTER — Ambulatory Visit: Attending: Obstetrics and Gynecology | Admitting: Obstetrics

## 2023-09-22 VITALS — BP 103/67

## 2023-09-22 DIAGNOSIS — Z3A19 19 weeks gestation of pregnancy: Secondary | ICD-10-CM | POA: Insufficient documentation

## 2023-09-22 DIAGNOSIS — O99212 Obesity complicating pregnancy, second trimester: Secondary | ICD-10-CM

## 2023-09-22 DIAGNOSIS — O34219 Maternal care for unspecified type scar from previous cesarean delivery: Secondary | ICD-10-CM

## 2023-09-22 DIAGNOSIS — E669 Obesity, unspecified: Secondary | ICD-10-CM | POA: Diagnosis not present

## 2023-09-22 DIAGNOSIS — O09522 Supervision of elderly multigravida, second trimester: Secondary | ICD-10-CM

## 2023-09-22 DIAGNOSIS — Z98891 History of uterine scar from previous surgery: Secondary | ICD-10-CM

## 2023-09-22 DIAGNOSIS — Z3480 Encounter for supervision of other normal pregnancy, unspecified trimester: Secondary | ICD-10-CM

## 2023-09-22 NOTE — Progress Notes (Unsigned)
 MFM Consult Note

## 2023-10-06 ENCOUNTER — Ambulatory Visit (INDEPENDENT_AMBULATORY_CARE_PROVIDER_SITE_OTHER): Admitting: Obstetrics and Gynecology

## 2023-10-06 VITALS — BP 100/57 | HR 86 | Wt 164.0 lb

## 2023-10-06 DIAGNOSIS — Z98891 History of uterine scar from previous surgery: Secondary | ICD-10-CM

## 2023-10-06 DIAGNOSIS — O09522 Supervision of elderly multigravida, second trimester: Secondary | ICD-10-CM | POA: Diagnosis not present

## 2023-10-06 DIAGNOSIS — Z3480 Encounter for supervision of other normal pregnancy, unspecified trimester: Secondary | ICD-10-CM

## 2023-10-06 DIAGNOSIS — Z3A21 21 weeks gestation of pregnancy: Secondary | ICD-10-CM

## 2023-10-06 NOTE — Progress Notes (Signed)
   PRENATAL VISIT NOTE  Subjective:  Savannah Wade is a 33 y.o. H3E7967 at [redacted]w[redacted]d being seen today for ongoing prenatal care.  She is currently monitored for the following issues for this low-risk pregnancy and has H/O cesarean section; Encounter for supervision of normal pregnancy, antepartum; and AMA (advanced maternal age) multigravida 35+ on their problem list.  Patient reports no complaints.  Contractions: Not present. Vag. Bleeding: None.  Movement: Present. Denies leaking of fluid.   The following portions of the patient's history were reviewed and updated as appropriate: allergies, current medications, past family history, past medical history, past social history, past surgical history and problem list.   Objective:   Vitals:   10/06/23 0902  BP: (!) 100/57  Pulse: 86  Weight: 164 lb (74.4 kg)    Fetal Status: Fetal Heart Rate (bpm): 143   Movement: Present     General:  Alert, oriented and cooperative. Patient is in no acute distress.  Skin: Skin is warm and dry. No rash noted.   Cardiovascular: Normal heart rate noted  Respiratory: Normal respiratory effort, no problems with respiration noted  Abdomen: Soft, gravid, appropriate for gestational age.  Pain/Pressure: Absent      Assessment and Plan:  Pregnancy: H3E7967 at [redacted]w[redacted]d 1. Supervision of other normal pregnancy, antepartum (Primary) 2. [redacted] weeks gestation of pregnancy Normal anatomy  Continues on vaginal progesterone  per her preference (hx non-consecutive SAB x3)  3. H/O cesarean section G1 FTP at 2cm, G2 scheduled repeat Desires repeat CS, requests 12/26 - surgery request placed Declines tubal, considering another pregnancy after this. Reviewed potential risks with 4th CS but that we can better assess adhesive disease with this pregnancy. Op note does not document any significant adhesive disease with G2 pregnancy.  4. Multigravida of advanced maternal age in second trimester Declined NIPS, normal anatomy Growth  scheduled 10/15  4. HSV Suppression at 36w  Please refer to After Visit Summary for other counseling recommendations.   Future Appointments  Date Time Provider Department Center  10/27/2023  8:50 AM Cleatus Moccasin, MD [facility]  12/01/2023 10:00 AM [facility] PROVIDER 1 [facility]  12/01/2023 10:30 AM [facility] US2 [facility]   Kieth JAYSON Carolin, MD

## 2023-10-06 NOTE — Progress Notes (Deleted)
[ ]   CS request 12/26

## 2023-10-13 ENCOUNTER — Encounter: Admitting: Obstetrics and Gynecology

## 2023-10-24 NOTE — Progress Notes (Unsigned)
   PRENATAL VISIT NOTE  Subjective:  Savannah Wade is a 33 y.o. H3E7967 at 110w0d being seen today for ongoing prenatal care.  She is currently monitored for the following issues for this low-risk pregnancy and has H/O cesarean section; Encounter for supervision of normal pregnancy, antepartum; and AMA (advanced maternal age) multigravida 35+ on their problem list.  Patient reports no complaints.  Contractions: Not present. Vag. Bleeding: None.  Movement: Present. Denies leaking of fluid.   The following portions of the patient's history were reviewed and updated as appropriate: allergies, current medications, past family history, past medical history, past social history, past surgical history and problem list.   Objective:    Vitals:   10/27/23 1316  Weight: 171 lb (77.6 kg)    Fetal Status:      Movement: Present    General: Alert, oriented and cooperative. Patient is in no acute distress.  Skin: Skin is warm and dry. No rash noted.   Cardiovascular: Normal heart rate noted  Respiratory: Normal respiratory effort, no problems with respiration noted  Abdomen: Soft, gravid, appropriate for gestational age.  Pain/Pressure: Absent     Pelvic: Cervical exam deferred        Extremities: Normal range of motion.  Edema: None  Mental Status: Normal mood and affect. Normal behavior. Normal judgment and thought content.   Assessment and Plan:  Pregnancy: [id number] at [redacted]w[redacted]d 1. Supervision of other normal pregnancy, antepartum (Primary) 28w labs next visit Flu shot - declines Discussed MOC: NFP  2. Pregnancy with 24 completed weeks gestation Continues on vaginal progesterone  per her preference (hx non-consecutive SAB x3)   3. H/O cesarean section Desires RLTCS. Will schedule next appt.  She is thinking about 12/31 vs 12/26. We discussed risk of labor during that interval but if she feels comfortable with that risk then we can schedule for whatever date is her preference. She will consider  and let us  know next appt.   Preterm labor symptoms and general obstetric precautions including but not limited to vaginal bleeding, contractions, leaking of fluid and fetal movement were reviewed in detail with the patient. Please refer to After Visit Summary for other counseling recommendations.   Return in about 4 weeks (around 11/24/2023) for OB VISIT, MD or APP, 2 hr GTT/28w labs.  Future Appointments  Date Time Provider Department Center  10/27/2023  1:30 PM Cleatus Moccasin, MD [facility]  12/01/2023 10:00 AM [facility] PROVIDER 1 [facility]  12/01/2023 10:30 AM [facility] US2 [facility]    Moccasin Cleatus, MD

## 2023-10-27 ENCOUNTER — Encounter: Payer: Self-pay | Admitting: Obstetrics and Gynecology

## 2023-10-27 ENCOUNTER — Ambulatory Visit (INDEPENDENT_AMBULATORY_CARE_PROVIDER_SITE_OTHER): Admitting: Obstetrics and Gynecology

## 2023-10-27 VITALS — BP 97/61 | HR 73 | Wt 171.0 lb

## 2023-10-27 DIAGNOSIS — Z3480 Encounter for supervision of other normal pregnancy, unspecified trimester: Secondary | ICD-10-CM

## 2023-10-27 DIAGNOSIS — Z98891 History of uterine scar from previous surgery: Secondary | ICD-10-CM | POA: Diagnosis not present

## 2023-10-27 DIAGNOSIS — Z3A24 24 weeks gestation of pregnancy: Secondary | ICD-10-CM | POA: Diagnosis not present

## 2023-10-27 DIAGNOSIS — M6208 Separation of muscle (nontraumatic), other site: Secondary | ICD-10-CM

## 2023-10-27 MED ORDER — PROGESTERONE MICRONIZED 100 MG PO CAPS: 100.0000 mg | ORAL_CAPSULE | Freq: Every day | ORAL | 3 refills | Status: DC

## 2023-11-25 ENCOUNTER — Ambulatory Visit (INDEPENDENT_AMBULATORY_CARE_PROVIDER_SITE_OTHER): Admitting: Obstetrics and Gynecology

## 2023-11-25 VITALS — BP 103/70 | HR 61 | Wt 175.0 lb

## 2023-11-25 DIAGNOSIS — B009 Herpesviral infection, unspecified: Secondary | ICD-10-CM | POA: Diagnosis not present

## 2023-11-25 DIAGNOSIS — Z3480 Encounter for supervision of other normal pregnancy, unspecified trimester: Secondary | ICD-10-CM

## 2023-11-25 DIAGNOSIS — Z3A28 28 weeks gestation of pregnancy: Secondary | ICD-10-CM | POA: Diagnosis not present

## 2023-11-25 DIAGNOSIS — Z98891 History of uterine scar from previous surgery: Secondary | ICD-10-CM

## 2023-11-25 HISTORY — DX: Herpesviral infection, unspecified: B00.9

## 2023-11-25 NOTE — Progress Notes (Deleted)
   PRENATAL VISIT NOTE  Subjective:  Savannah Wade is a 33 y.o. H3E7967 at [redacted]w[redacted]d being seen today for ongoing prenatal care.  She is currently monitored for the following issues for this low-risk pregnancy and has H/O cesarean section; Encounter for supervision of normal pregnancy, antepartum; AMA (advanced maternal age) multigravida 35+; and Diastasis recti on their problem list.  Patient reports {sx:14538}.   .  .   . Denies leaking of fluid.   The following portions of the patient's history were reviewed and updated as appropriate: allergies, current medications, past family history, past medical history, past social history, past surgical history and problem list.   Objective:    There were no vitals filed for this visit.  Fetal Status:           General: Alert, oriented and cooperative. Patient is in no acute distress.  Skin: Skin is warm and dry. No rash noted.   Cardiovascular: Normal heart rate noted  Respiratory: Normal respiratory effort, no problems with respiration noted  Abdomen: Soft, gravid, appropriate for gestational age.        Pelvic: {Blank single:19197::Cervical exam performed in the presence of a chaperone,Cervical exam deferred}        Extremities: Normal range of motion.     Mental Status: Normal mood and affect. Normal behavior. Normal judgment and thought content.   Assessment and Plan:  Pregnancy: H3E7967 at [redacted]w[redacted]d 1. Supervision of other normal pregnancy, antepartum (Primary) ***  2. [redacted] weeks gestation of pregnancy - 2 HR GTT today  3. H/O cesarean section - C-section x 2, desires repeat    31st,,    Preterm labor symptoms and general obstetric precautions including but not limited to vaginal bleeding, contractions, leaking of fluid and fetal movement were reviewed in detail with the patient. Please refer to After Visit Summary for other counseling recommendations.   No follow-ups on file.  Future Appointments  Date Time Provider Department  Center  11/25/2023  8:50 AM Delores Nidia CROME, FNP [facility]  12/01/2023 10:00 AM [facility] PROVIDER 1 [facility]  12/01/2023 10:30 AM [facility] US2 [facility]   Vernell Ruddle, SNM 11/25/2023 8:49 AM

## 2023-11-25 NOTE — Progress Notes (Signed)
   PRENATAL VISIT NOTE  Subjective:  Savannah Wade is a 33 y.o. H3E7967 at [redacted]w[redacted]d being seen today for ongoing prenatal care.  She is currently monitored for the following issues for this low-risk pregnancy and has H/O cesarean section; Encounter for supervision of normal pregnancy, antepartum; AMA (advanced maternal age) multigravida 35+; Diastasis recti; and HSV infection on their problem list.  Patient reports no complaints.  Contractions: Irritability. Vag. Bleeding: None.  Movement: Present. Denies leaking of fluid.   The following portions of the patient's history were reviewed and updated as appropriate: allergies, current medications, past family history, past medical history, past social history, past surgical history and problem list.   Objective:   Vitals:   11/25/23 0859  BP: 103/70  Pulse: 61  Weight: 79.4 kg   Fetal Status:  Fetal Heart Rate (bpm): 131 Fundal Height: 28 cm Movement: Present    General: Alert, oriented and cooperative. Patient is in no acute distress.  Skin: Skin is warm and dry. No rash noted.   Cardiovascular: Normal heart rate noted  Respiratory: Normal respiratory effort, no problems with respiration noted  Abdomen: Soft, gravid, appropriate for gestational age.  Pain/Pressure: Absent     Pelvic: Cervical exam deferred        Extremities: Normal range of motion.  Edema: None  Mental Status: Normal mood and affect. Normal behavior. Normal judgment and thought content.   Assessment and Plan:  Pregnancy: H3E7967 at [redacted]w[redacted]d 1. Supervision of other normal pregnancy, antepartum (Primary) - Feeling regular, vigorous fetal movement - No concerns today  2. [redacted] weeks gestation of pregnancy - Declines Tdap vaccine - Glucose Tolerance, 2 Hours w/1 Hour - CBC - RPR - HIV Antibody (routine testing w rflx)  3. H/O cesarean section - C-section x 2, desires repeat, scheduling for 02/16/24 previously requested by Dr. Erik  4. HSV infection - Prophylaxis to  begin at 36 weeks  Preterm labor symptoms and general obstetric precautions including but not limited to vaginal bleeding, contractions, leaking of fluid and fetal movement were reviewed in detail with the patient. Please refer to After Visit Summary for other counseling recommendations.   Return in about 2 weeks (around 12/09/2023) for OB VISIT (MD or APP).  Future Appointments  Date Time Provider Department Center  12/01/2023 10:00 AM [facility] PROVIDER 1 [facility]  12/01/2023 10:30 AM [facility] US2 [facility]  12/09/2023  8:50 AM Erik Kieth BROCKS, MD [facility]   Vernell Ruddle, [facility] 11/25/2023 11:44 AM

## 2023-11-26 ENCOUNTER — Ambulatory Visit: Payer: Self-pay | Admitting: Obstetrics and Gynecology

## 2023-11-26 ENCOUNTER — Other Ambulatory Visit: Payer: Self-pay

## 2023-11-26 DIAGNOSIS — O24419 Gestational diabetes mellitus in pregnancy, unspecified control: Secondary | ICD-10-CM

## 2023-11-26 DIAGNOSIS — O24410 Gestational diabetes mellitus in pregnancy, diet controlled: Secondary | ICD-10-CM | POA: Insufficient documentation

## 2023-11-26 HISTORY — DX: Gestational diabetes mellitus in pregnancy, diet controlled: O24.410

## 2023-11-26 LAB — RPR: RPR Ser Ql: NONREACTIVE

## 2023-11-26 LAB — GLUCOSE TOLERANCE, 2 HOURS W/ 1HR
Glucose, 1 hour: 142 mg/dL (ref 70–179)
Glucose, 2 hour: 176 mg/dL — ABNORMAL HIGH (ref 70–152)
Glucose, Fasting: 69 mg/dL — ABNORMAL LOW (ref 70–91)

## 2023-11-26 LAB — CBC
Hematocrit: 37.6 % (ref 34.0–46.6)
Hemoglobin: 12.3 g/dL (ref 11.1–15.9)
MCH: 31 pg (ref 26.6–33.0)
MCHC: 32.7 g/dL (ref 31.5–35.7)
MCV: 95 fL (ref 79–97)
Platelets: 241 x10E3/uL (ref 150–450)
RBC: 3.97 x10E6/uL (ref 3.77–5.28)
RDW: 13 % (ref 11.7–15.4)
WBC: 10.3 x10E3/uL (ref 3.4–10.8)

## 2023-11-26 LAB — HIV ANTIBODY (ROUTINE TESTING W REFLEX): HIV Screen 4th Generation wRfx: NONREACTIVE

## 2023-11-26 MED ORDER — ACCU-CHEK GUIDE W/DEVICE KIT: 1.0000 | PACK | Freq: Four times a day (QID) | 0 refills | Status: DC

## 2023-11-26 MED ORDER — GLUCOSE BLOOD VI STRP: ORAL_STRIP | 3 refills | Status: DC

## 2023-11-26 MED ORDER — ACCU-CHEK SOFTCLIX LANCETS MISC: 1.0000 | Freq: Four times a day (QID) | 3 refills | Status: DC

## 2023-11-26 NOTE — Progress Notes (Signed)
 Verbal orders received for GDM supplies and referral for education, sent to pharmacy, patient notified via my chart.  Silvano LELON Piano, RN

## 2023-12-01 ENCOUNTER — Ambulatory Visit: Attending: Obstetrics | Admitting: Maternal & Fetal Medicine

## 2023-12-01 ENCOUNTER — Other Ambulatory Visit: Payer: Self-pay | Admitting: *Deleted

## 2023-12-01 ENCOUNTER — Ambulatory Visit

## 2023-12-01 VITALS — BP 105/60

## 2023-12-01 DIAGNOSIS — O24013 Pre-existing diabetes mellitus, type 1, in pregnancy, third trimester: Secondary | ICD-10-CM

## 2023-12-01 DIAGNOSIS — O99213 Obesity complicating pregnancy, third trimester: Secondary | ICD-10-CM | POA: Insufficient documentation

## 2023-12-01 DIAGNOSIS — Z98891 History of uterine scar from previous surgery: Secondary | ICD-10-CM

## 2023-12-01 DIAGNOSIS — O09522 Supervision of elderly multigravida, second trimester: Secondary | ICD-10-CM

## 2023-12-01 DIAGNOSIS — O34219 Maternal care for unspecified type scar from previous cesarean delivery: Secondary | ICD-10-CM | POA: Insufficient documentation

## 2023-12-01 DIAGNOSIS — O09523 Supervision of elderly multigravida, third trimester: Secondary | ICD-10-CM | POA: Insufficient documentation

## 2023-12-01 DIAGNOSIS — Z3A29 29 weeks gestation of pregnancy: Secondary | ICD-10-CM | POA: Insufficient documentation

## 2023-12-01 DIAGNOSIS — E669 Obesity, unspecified: Secondary | ICD-10-CM

## 2023-12-01 DIAGNOSIS — O24410 Gestational diabetes mellitus in pregnancy, diet controlled: Secondary | ICD-10-CM | POA: Insufficient documentation

## 2023-12-01 DIAGNOSIS — E109 Type 1 diabetes mellitus without complications: Secondary | ICD-10-CM

## 2023-12-01 DIAGNOSIS — Z362 Encounter for other antenatal screening follow-up: Secondary | ICD-10-CM | POA: Insufficient documentation

## 2023-12-01 NOTE — Progress Notes (Signed)
 Patient information  Patient Name: Savannah Wade  Patient MRN:   [id number]  Referring practice: MFM Referring Provider: [facility]  Problem List   Patient Active Problem List   Diagnosis Date Noted   Gestational diabetes mellitus, diet-controlled 11/26/2023   HSV infection 11/25/2023   Diastasis recti 10/27/2023   Encounter for supervision of normal pregnancy, antepartum 08/18/2023   AMA (advanced maternal age) multigravida 35+ 08/18/2023   H/O cesarean section 10/16/2015   Maternal Fetal medicine Consult  Savannah Wade is a 33 y.o. H3E7967 at [redacted]w[redacted]d here for ultrasound and consultation. Savannah Wade is doing well today with no acute concerns. Today we focused on the following:   GDMA1: The patient has blood glucose values that are all well controlled. The fetus is well growth at the larger end of normal.  We discussed the importance of glycemic control.   History of C-section x 2: The patient is considering a repeat C-section.  This occurred previously at 39 weeks with her most recent pregnancy.  I discussed this is likely the same gestational age we will plan for this pregnancy.  The patient had time to ask questions that were answered to her satisfaction.  She verbalized understanding and agrees to proceed with the plan below.  Sonographic findings Single intrauterine pregnancy at 29w 0d.  Fetal cardiac activity:  Observed and appears normal. Presentation: Cephalic. Interval fetal anatomy appears normal. Fetal biometry shows the estimated fetal weight at the 90 percentile. Amniotic fluid volume: Within normal limits. MVP: 7.24 cm. Placenta: Anterior Right.  There are limitations of prenatal ultrasound such as the inability to detect certain abnormalities due to poor visualization. Various factors such as fetal position, gestational age and maternal body habitus may increase the difficulty in visualizing the fetal anatomy.    Recommendations -Serial growth  ultrasounds every 4-6 weeks until delivery -Repeat CD at 20 w  Review of Systems: A review of systems was performed and was negative except per HPI   Vitals and Physical Exam    12/01/2023   10:09 AM 11/25/2023    8:59 AM 10/27/2023    1:16 PM  Vitals with BMI  Weight  175 lbs 171 lbs  Systolic 105 103 97  Diastolic 60 70 61  Pulse  61 73    Sitting comfortably on the sonogram table Nonlabored breathing Normal rate and rhythm Abdomen is nontender  Past pregnancies OB History  Gravida Para Term Preterm AB Living  6 2 2  3 2   SAB IAB Ectopic Multiple Live Births  1   0 2    # Outcome Date GA Lbr Len/2nd Weight Sex Type Anes PTL Lv  6 Current           5 AB 02/07/23     SAB     4 Term 10/28/17 [redacted]w[redacted]d  7 lb 9 oz (3.43 kg) F CS-Vac Spinal  LIV  3 AB 2018     SAB     2 Term 09/09/15 [redacted]w[redacted]d  7 lb 6 oz (3.345 kg) F CS-LTranv   LIV     Complications: Cephalopelvic Disproportion  1 SAB 2016     SAB        I spent 30 minutes reviewing the patients chart, including labs and images as well as counseling the patient about her medical conditions. Greater than 50% of the time was spent in direct face-to-face patient counseling.  Savannah Wade  MFM, [facility]   12/01/2023  11:14 AM

## 2023-12-08 NOTE — Progress Notes (Deleted)
 Patient was seen on *** for Gestational Diabetes self-management class at the [facility]. The following learning objectives were met by the patient during this course:  States the definition of Gestational Diabetes States why dietary management is important in controlling blood glucose Describes the effects each nutrient has on blood glucose levels Demonstrates ability to create a balanced meal plan Demonstrates carbohydrate counting  States when to check blood glucose levels Demonstrates proper blood glucose monitoring techniques States the effect of stress and exercise on blood glucose levels States the importance of limiting caffeine and abstaining from alcohol and smoking  Blood glucose monitor given: *** Lot # *** Exp: *** Blood glucose reading: ***  *** Patient has a meter prior to visit. Patient is *** testing pre breakfast and 2 hours after each meal. FBS: *** Postprandial: *** Blood glucose today in class ***  Patient instructed to monitor glucose levels:  QID FBS: 60 - <95 1 hour: <140 2 hour: <120  *Patient received handouts: Nutrition Diabetes and Pregnancy Carbohydrate Counting List Blood glucose log Snack ideas for diabetes during pregnancy Plate Planner  Patient will be seen for follow-up as needed.

## 2023-12-09 ENCOUNTER — Ambulatory Visit (INDEPENDENT_AMBULATORY_CARE_PROVIDER_SITE_OTHER): Admitting: Obstetrics and Gynecology

## 2023-12-09 VITALS — BP 112/63 | HR 82 | Wt 174.1 lb

## 2023-12-09 DIAGNOSIS — O09523 Supervision of elderly multigravida, third trimester: Secondary | ICD-10-CM

## 2023-12-09 DIAGNOSIS — Z3A30 30 weeks gestation of pregnancy: Secondary | ICD-10-CM

## 2023-12-09 DIAGNOSIS — Z98891 History of uterine scar from previous surgery: Secondary | ICD-10-CM

## 2023-12-09 DIAGNOSIS — O24410 Gestational diabetes mellitus in pregnancy, diet controlled: Secondary | ICD-10-CM | POA: Diagnosis not present

## 2023-12-09 DIAGNOSIS — Z3480 Encounter for supervision of other normal pregnancy, unspecified trimester: Secondary | ICD-10-CM

## 2023-12-09 DIAGNOSIS — Z789 Other specified health status: Secondary | ICD-10-CM

## 2023-12-09 NOTE — Progress Notes (Signed)
 PRENATAL VISIT NOTE  Subjective:  Savannah Wade is a 33 y.o. H3E7967 at [redacted]w[redacted]d being seen today for ongoing prenatal care.  She is currently monitored for the following issues for this high-risk pregnancy and has H/O cesarean section; Encounter for supervision of normal pregnancy, antepartum; AMA (advanced maternal age) multigravida 35+; Diastasis recti; HSV infection; and Gestational diabetes mellitus, diet-controlled on their problem list.  Patient reports doing well overall.  Contractions: Not present. Vag. Bleeding: None.  Movement: Present. Denies leaking of fluid.   The following portions of the patient's history were reviewed and updated as appropriate: allergies, current medications, past family history, past medical history, past social history, past surgical history and problem list.   Objective:   Vitals:   12/09/23 0851  BP: 112/63  Pulse: 82  Weight: 174 lb 1.9 oz (79 kg)   Fetal Status: Fetal Heart Rate (bpm): 135   Movement: Present     General:  Alert, oriented and cooperative. Patient is in no acute distress.  Skin: Skin is warm and dry. No rash noted.   Cardiovascular: Normal heart rate noted  Respiratory: Normal respiratory effort, no problems with respiration noted  Abdomen: Soft, gravid, appropriate for gestational age.  Pain/Pressure: Absent      Assessment and Plan:  Pregnancy: H3E7967 at [redacted]w[redacted]d 1. Supervision of other normal pregnancy, antepartum (Primary) 2. [redacted] weeks gestation of pregnancy  3. H/O cesarean section G1 FTP at 2cm, G2 scheduled repeat Desires repeat CS Op note does not document any significant adhesive disease with G2 pregnancy. Surgery request previously placed for 39 weeks, will follow up  4. Diet controlled gestational diabetes mellitus (GDM) in third trimester - Reviewed diagnosis of GDM - Discussed the risks associated in pregnancy especially with poor control including but not limited to increased risk of preeclampsia, macrosomia,  need for operative delivery I.e. vacuum, forcep, c-section, shoulder dystocia and resulting potential nerve injury.  - Discussed diet and exercise modifications.  - We discussed the possibility of management of the pregnancy with medications I.e. Metformin or Insulin. Discussed if medication initiated, that monitoring during the pregnancy will be started at 32 wks or at the time of medication initiation - Counseled on importance of postpartum follow up testing to ensure resolution and ensure patient does not have ongoing DM - Discussed lifelong increased risk of DM and increased risk of GDM in future pregnancies - BG reviewed and all well controlled with dietary changes alone - Growth US  scheduled 10/20  5. Multigravida of advanced maternal age in third trimester Normal anatomy, NIPS not completed  6. Refusal of blood product In the case of a transfusion, she does not want to receive any blood from someone who received the covid vaccine. She has a friend who is willing to privately donate blood in the event she were to need a transfusion. Will contact blood bank to clarify this process Third tri Hgb 12.3  Please refer to After Visit Summary for other counseling recommendations.   Return in about 2 weeks (around 12/23/2023) for return OB at 32 weeks.  Future Appointments  Date Time Provider Department Center  12/15/2023  9:00 AM [facility] GDM CLASS [facility] NDM  12/28/2023  9:50 AM Rasch, Delon FERNS, NP [facility]  01/05/2024  2:00 PM [facility] PROVIDER 1 [facility]  01/06/2024  2:15 PM [facility] US1 [facility]  01/11/2024  9:30 AM Rasch, Delon FERNS, NP [facility]  01/25/2024  8:10 AM Rasch, Delon FERNS, NP [facility]  02/02/2024  8:10  AM Erik Kieth BROCKS, MD [facility]  02/03/2024  1:15 PM [facility] PROVIDER 1 [facility]  02/03/2024  1:30 PM [facility] US2 [facility]  02/07/2024  8:10 AM Erik Kieth BROCKS, MD [facility]     Kieth BROCKS Erik, MD

## 2023-12-15 ENCOUNTER — Ambulatory Visit

## 2023-12-15 ENCOUNTER — Telehealth: Payer: Self-pay | Admitting: *Deleted

## 2023-12-15 DIAGNOSIS — O24419 Gestational diabetes mellitus in pregnancy, unspecified control: Secondary | ICD-10-CM

## 2023-12-15 NOTE — Telephone Encounter (Signed)
 Patient left a message to call her back. She needs an approval letter to have a free U/S with [facility].

## 2023-12-21 ENCOUNTER — Encounter: Payer: Self-pay | Admitting: Obstetrics and Gynecology

## 2023-12-28 ENCOUNTER — Ambulatory Visit (INDEPENDENT_AMBULATORY_CARE_PROVIDER_SITE_OTHER): Admitting: Obstetrics and Gynecology

## 2023-12-28 VITALS — BP 107/67 | HR 77 | Wt 173.0 lb

## 2023-12-28 DIAGNOSIS — Z3480 Encounter for supervision of other normal pregnancy, unspecified trimester: Secondary | ICD-10-CM

## 2023-12-28 DIAGNOSIS — O24410 Gestational diabetes mellitus in pregnancy, diet controlled: Secondary | ICD-10-CM | POA: Diagnosis not present

## 2023-12-28 DIAGNOSIS — O09523 Supervision of elderly multigravida, third trimester: Secondary | ICD-10-CM

## 2023-12-28 DIAGNOSIS — Z3A32 32 weeks gestation of pregnancy: Secondary | ICD-10-CM | POA: Diagnosis not present

## 2023-12-28 MED ORDER — VALACYCLOVIR HCL 500 MG PO TABS: 500.0000 mg | ORAL_TABLET | Freq: Two times a day (BID) | ORAL | 0 refills | Status: DC

## 2023-12-28 NOTE — Progress Notes (Signed)
 PRENATAL VISIT NOTE  Subjective:  Savannah Wade is a 33 y.o. H3E7967 at [redacted]w[redacted]d being seen today for ongoing prenatal care.  She is currently monitored for the following issues for this high-risk pregnancy and has H/O cesarean section; Encounter for supervision of normal pregnancy, antepartum; AMA (advanced maternal age) multigravida 35+; Diastasis recti; HSV infection; and Gestational diabetes mellitus, diet-controlled on their problem list.  Patient reports no complaints.  Contractions: Irregular. Vag. Bleeding: None.  Movement: Present. Denies leaking of fluid.   The following portions of the patient's history were reviewed and updated as appropriate: allergies, current medications, past family history, past medical history, past social history, past surgical history and problem list.   Objective:   Vitals:   12/28/23 0815  BP: 107/67  Pulse: 77  Weight: 173 lb (78.5 kg)    Fetal Status:  Fetal Heart Rate (bpm): 139 Fundal Height: 35 cm Movement: Present    General: Alert, oriented and cooperative. Patient is in no acute distress.  Skin: Skin is warm and dry. No rash noted.   Cardiovascular: Normal heart rate noted  Respiratory: Normal respiratory effort, no problems with respiration noted  Abdomen: Soft, gravid, appropriate for gestational age.  Pain/Pressure: Present     Pelvic: Cervical exam deferred        Extremities: Normal range of motion.  Edema: None  Mental Status: Normal mood and affect. Normal behavior. Normal judgment and thought content.      08/18/2023    9:11 AM  Depression screen PHQ 2/9  Decreased Interest 1  Down, Depressed, Hopeless 0  PHQ - 2 Score 1  Altered sleeping 1  Tired, decreased energy 1  Change in appetite 0  Feeling bad or failure about yourself  0  Trouble concentrating 0  Moving slowly or fidgety/restless 0  Suicidal thoughts 0  PHQ-9 Score 3      Data saved with a previous flowsheet row definition        08/18/2023    9:11 AM  GAD 7  : Generalized Anxiety Score  Nervous, Anxious, on Edge 2  Control/stop worrying 2  Worry too much - different things 2  Trouble relaxing 1  Restless 0  Easily annoyed or irritable 0  Afraid - awful might happen 2  Total GAD 7 Score 9    Assessment and Plan:  Pregnancy: H3E7967 at [redacted]w[redacted]d  1. Supervision of other normal pregnancy, antepartum (Primary)  Doing well Good movement. Will send RX for Valtrex .   2. Diet controlled gestational diabetes mellitus (GDM) in third trimester  Complete BS log provided today for review She declines CGM All fasting BS are <95 Majority of 2 hour PP within target range <120 with only a few outliers Continue diet control   3. Multigravida of advanced maternal age in third trimester  Continue growth US  with MFM Delivery timing likely 39w but will wait for MFM input. Last EFW 90%tile.    Preterm labor symptoms and general obstetric precautions including but not limited to vaginal bleeding, contractions, leaking of fluid and fetal movement were reviewed in detail with the patient. Please refer to After Visit Summary for other counseling recommendations.   No follow-ups on file.  Future Appointments  Date Time Provider Department Center  01/05/2024  2:00 PM [facility] PROVIDER 1 [facility]  01/06/2024  2:15 PM [facility] US1 [facility]  01/11/2024  9:30 AM Crosby Oriordan, Delon FERNS, NP [facility]  01/25/2024  8:10 AM Cannon Arreola, Delon FERNS, NP [facility]  02/02/2024  8:10 AM Erik Kieth BROCKS, MD [facility]  02/03/2024  1:15 PM [facility] PROVIDER 1 [facility]  02/03/2024  1:30 PM [facility] US2 [facility]  02/07/2024  8:10 AM Erik Kieth BROCKS, MD [facility]    Delon Emms, NP

## 2024-01-05 ENCOUNTER — Ambulatory Visit

## 2024-01-06 ENCOUNTER — Telehealth: Payer: Self-pay

## 2024-01-06 ENCOUNTER — Ambulatory Visit (HOSPITAL_BASED_OUTPATIENT_CLINIC_OR_DEPARTMENT_OTHER): Admitting: Obstetrics

## 2024-01-06 ENCOUNTER — Other Ambulatory Visit: Payer: Self-pay | Admitting: Maternal & Fetal Medicine

## 2024-01-06 ENCOUNTER — Ambulatory Visit: Attending: Maternal & Fetal Medicine

## 2024-01-06 VITALS — BP 104/59 | HR 74

## 2024-01-06 DIAGNOSIS — E669 Obesity, unspecified: Secondary | ICD-10-CM

## 2024-01-06 DIAGNOSIS — O24410 Gestational diabetes mellitus in pregnancy, diet controlled: Secondary | ICD-10-CM

## 2024-01-06 DIAGNOSIS — Z3A34 34 weeks gestation of pregnancy: Secondary | ICD-10-CM | POA: Diagnosis present

## 2024-01-06 DIAGNOSIS — O3663X0 Maternal care for excessive fetal growth, third trimester, not applicable or unspecified: Secondary | ICD-10-CM | POA: Diagnosis present

## 2024-01-06 DIAGNOSIS — O34219 Maternal care for unspecified type scar from previous cesarean delivery: Secondary | ICD-10-CM

## 2024-01-06 DIAGNOSIS — O09523 Supervision of elderly multigravida, third trimester: Secondary | ICD-10-CM | POA: Diagnosis present

## 2024-01-06 DIAGNOSIS — O99213 Obesity complicating pregnancy, third trimester: Secondary | ICD-10-CM

## 2024-01-06 NOTE — Progress Notes (Signed)
 MFM Consult Note  Savannah Wade is currently at [redacted]w[redacted]d. She has been followed due to advanced maternal age and diet-controlled gestational diabetes.    She denies any problems since her last exam and reports that her fingerstick values have mostly been within normal limits.    A large for gestational age fetus was noted on her prior ultrasound exam.  Sonographic findings Single intrauterine pregnancy at 34w 1d.  Fetal cardiac activity:  Observed and appears normal. Presentation: Transverse, head to maternal right. Fetal biometry shows the estimated fetal weight of 6 lb 5 oz,  2872g (94%). Amniotic fluid volume: Subjectively upper-normal. AFI: 19.47cm.  MVP: 9.24 cm. Placenta: Anterior Right. BPP: 8/8.  Large for gestational age fetus and gestational diabetes  The increased risk of an adverse outcome such as a fetal demise in women with gestational diabetes with a large for gestational age fetus was discussed.  Due to today's ultrasound findings, she should continue weekly fetal testing until delivery.    She will have her weekly NSTs scheduled in the [place] office.    Due to the large for gestational age fetus, delivery is recommended at between 70 to 38 weeks.  No further exams were scheduled in our office.  The patient stated that all of her questions were answered.   A total of 20 minutes was spent counseling and coordinating the care for this patient.  Greater than 50% of the time was spent in direct face-to-face contact.

## 2024-01-06 NOTE — Telephone Encounter (Signed)
 Patient called office to see if she could get her progesterone  (PROMETRIUM ) 100 MG capsule [[id number]] sent into the pharmacy today, please advise, thanks.

## 2024-01-10 ENCOUNTER — Other Ambulatory Visit: Payer: Self-pay | Admitting: Obstetrics and Gynecology

## 2024-01-10 DIAGNOSIS — Z3A24 24 weeks gestation of pregnancy: Secondary | ICD-10-CM

## 2024-01-10 DIAGNOSIS — Z3480 Encounter for supervision of other normal pregnancy, unspecified trimester: Secondary | ICD-10-CM

## 2024-01-10 MED ORDER — PROGESTERONE MICRONIZED 100 MG PO CAPS: 100.0000 mg | ORAL_CAPSULE | Freq: Every day | ORAL | 3 refills | Status: DC

## 2024-01-10 NOTE — Progress Notes (Signed)
 Refill sent as per her preference to continue until end of her pregnancy.   Vina Solian, MD Attending Obstetrician & Gynecologist, [facility] for [facility], [facility]

## 2024-01-11 ENCOUNTER — Ambulatory Visit: Admitting: Obstetrics and Gynecology

## 2024-01-11 VITALS — BP 120/66 | HR 70 | Wt 172.0 lb

## 2024-01-11 DIAGNOSIS — B009 Herpesviral infection, unspecified: Secondary | ICD-10-CM

## 2024-01-11 DIAGNOSIS — Z98891 History of uterine scar from previous surgery: Secondary | ICD-10-CM | POA: Diagnosis not present

## 2024-01-11 DIAGNOSIS — O24410 Gestational diabetes mellitus in pregnancy, diet controlled: Secondary | ICD-10-CM | POA: Diagnosis not present

## 2024-01-11 DIAGNOSIS — O3663X0 Maternal care for excessive fetal growth, third trimester, not applicable or unspecified: Secondary | ICD-10-CM | POA: Diagnosis not present

## 2024-01-11 DIAGNOSIS — Z3A34 34 weeks gestation of pregnancy: Secondary | ICD-10-CM

## 2024-01-11 NOTE — Progress Notes (Signed)
 PRENATAL VISIT NOTE  Subjective:  Savannah Wade is a 33 y.o. H3E7967 at [redacted]w[redacted]d being seen today for ongoing prenatal care.  She is currently monitored for the following issues for this high-risk pregnancy and has H/O cesarean section; Encounter for supervision of normal pregnancy, antepartum; AMA (advanced maternal age) multigravida 35+; Diastasis recti; HSV infection; and Gestational diabetes mellitus, diet-controlled on their problem list.  Patient reports no complaints.  Contractions: Not present. Vag. Bleeding: None.   . Denies leaking of fluid.   The following portions of the patient's history were reviewed and updated as appropriate: allergies, current medications, past family history, past medical history, past social history, past surgical history and problem list.   Objective:   Vitals:   01/11/24 0932  BP: 120/66  Pulse: 70  Weight: 172 lb (78 kg)    Fetal Status:           General: Alert, oriented and cooperative. Patient is in no acute distress.  Skin: Skin is warm and dry. No rash noted.   Cardiovascular: Normal heart rate noted  Respiratory: Normal respiratory effort, no problems with respiration noted  Abdomen: Soft, gravid, appropriate for gestational age.  Pain/Pressure: Absent     Pelvic: Cervical exam deferred        Extremities: Normal range of motion.  Edema: None  Mental Status: Normal mood and affect. Normal behavior. Normal judgment and thought content.      08/18/2023    9:11 AM  Depression screen PHQ 2/9  Decreased Interest 1  Down, Depressed, Hopeless 0  PHQ - 2 Score 1  Altered sleeping 1  Tired, decreased energy 1  Change in appetite 0  Feeling bad or failure about yourself  0  Trouble concentrating 0  Moving slowly or fidgety/restless 0  Suicidal thoughts 0  PHQ-9 Score 3      Data saved with a previous flowsheet row definition        08/18/2023    9:11 AM  GAD 7 : Generalized Anxiety Score  Nervous, Anxious, on Edge 2  Control/stop  worrying 2  Worry too much - different things 2  Trouble relaxing 1  Restless 0  Easily annoyed or irritable 0  Afraid - awful might happen 2  Total GAD 7 Score 9    Assessment and Plan:  Pregnancy: H3E7967 at [redacted]w[redacted]d  1. Excessive fetal growth affecting management of pregnancy in third trimester, single or unspecified fetus (Primary)  >94%tile with last MFM US  (01/06/24) Last BPP 8/8  on 01/06/24 Will schedule for twice weekly NST's here in our office next week.  Reports normal fetal movement. Fundal height appropriate at 35 cm   2. Diet controlled gestational diabetes mellitus (GDM) in third trimester  No log today Reports all BS less than 95, one outlier today 98.  2 hour PP less than 120.  Lengthy discussion about importance of testing blood sugars, bringing log book and keeping appointments. Discussed risks of uncontrolled DM in pregnancy including delayed fetal lung maturity, IUFD and shoulder dystocia possibly resulting brachial plexus palsy, brain damage, intrapartum death and extensive obstetric lacerations. Patient verbalizes understanding.   3. H/O cesarean section  Repeat scheduled at 37 weeks; 01/26/2024 per MFM. Changed from 39 weeks that was previously scheduled.   4. HSV infection  Taking suppression.   Preterm labor symptoms and general obstetric precautions including but not limited to vaginal bleeding, contractions, leaking of fluid and fetal movement were reviewed in detail with the patient. Please refer to  After Visit Summary for other counseling recommendations.   No follow-ups on file.  Future Appointments  Date Time Provider Department Center  01/25/2024  8:10 AM Kier Smead, Delon FERNS, NP [facility]  02/02/2024  8:10 AM Erik Kieth BROCKS, MD [facility]  02/07/2024  8:10 AM Erik Kieth BROCKS, MD [facility]    Delon Emms, NP

## 2024-01-14 NOTE — Patient Instructions (Addendum)
 Savannah Wade  01/14/2024   Your procedure is scheduled on:  01/26/2024  Arrive at 0800 at Entrance C on [address] at [facility]  and [employer]. You are invited to use the FREE valet parking or use the Visitor's parking deck.  Pick up the phone at the desk and dial [phone].  Call this number if you have problems the morning of surgery: [phone]  Remember:   Do not eat food:(After Midnight) Desps de medianoche.  You may drink clear liquids until  __0600___.  Clear liquids means a liquid you can see thru.  It can have color such as Cola or Kool aid.  Tea is OK and coffee as long as no milk or creamer of any kind.  Take these medicines the morning of surgery with A SIP OF WATER :  valtrex    Do not wear jewelry, make-up or nail polish.  Do not wear lotions, powders, or perfumes. Do not wear deodorant.  Do not shave 48 hours prior to surgery.  Do not bring valuables to the hospital.  [facility] is not   responsible for any belongings or valuables brought to the hospital.  Contacts, dentures or bridgework may not be worn into surgery.  Leave suitcase in the car. After surgery it may be brought to your room.  For patients admitted to the hospital, checkout time is 11:00 AM the day of              discharge.      Please read over the following fact sheets that you were given:     Preparing for Surgery

## 2024-01-17 ENCOUNTER — Ambulatory Visit

## 2024-01-17 ENCOUNTER — Encounter (HOSPITAL_COMMUNITY): Payer: Self-pay

## 2024-01-17 ENCOUNTER — Telehealth (HOSPITAL_COMMUNITY): Payer: Self-pay | Admitting: *Deleted

## 2024-01-17 DIAGNOSIS — O3663X0 Maternal care for excessive fetal growth, third trimester, not applicable or unspecified: Secondary | ICD-10-CM

## 2024-01-17 DIAGNOSIS — Z3A36 36 weeks gestation of pregnancy: Secondary | ICD-10-CM | POA: Diagnosis not present

## 2024-01-17 DIAGNOSIS — O09529 Supervision of elderly multigravida, unspecified trimester: Secondary | ICD-10-CM | POA: Diagnosis not present

## 2024-01-17 NOTE — Progress Notes (Signed)
 Pt here for NST. Pt reported good fetal movement with no current concerns. Pt is 35 weeks 5 days. NST reviewed by RN and Dr. Cris, reactive and reassuring for gestational age. Pt to follow up as scheduled.   Silvano LELON Piano, RN

## 2024-01-17 NOTE — Telephone Encounter (Signed)
 Preadmission screen

## 2024-01-18 ENCOUNTER — Encounter (HOSPITAL_COMMUNITY): Payer: Self-pay

## 2024-01-20 ENCOUNTER — Ambulatory Visit

## 2024-01-20 ENCOUNTER — Other Ambulatory Visit (HOSPITAL_COMMUNITY)
Admission: RE | Admit: 2024-01-20 | Discharge: 2024-01-20 | Disposition: A | Source: Ambulatory Visit | Attending: Obstetrics and Gynecology | Admitting: Obstetrics and Gynecology

## 2024-01-20 VITALS — BP 106/72 | HR 85 | Wt 174.0 lb

## 2024-01-20 DIAGNOSIS — O3663X0 Maternal care for excessive fetal growth, third trimester, not applicable or unspecified: Secondary | ICD-10-CM

## 2024-01-20 DIAGNOSIS — Z3A36 36 weeks gestation of pregnancy: Secondary | ICD-10-CM | POA: Diagnosis not present

## 2024-01-20 DIAGNOSIS — O24410 Gestational diabetes mellitus in pregnancy, diet controlled: Secondary | ICD-10-CM

## 2024-01-20 DIAGNOSIS — Z3480 Encounter for supervision of other normal pregnancy, unspecified trimester: Secondary | ICD-10-CM

## 2024-01-20 DIAGNOSIS — O09523 Supervision of elderly multigravida, third trimester: Secondary | ICD-10-CM

## 2024-01-20 DIAGNOSIS — O09529 Supervision of elderly multigravida, unspecified trimester: Secondary | ICD-10-CM | POA: Diagnosis not present

## 2024-01-20 LAB — CERVICOVAGINAL ANCILLARY ONLY
Chlamydia: NEGATIVE
Comment: NEGATIVE
Comment: NORMAL
Neisseria Gonorrhea: NEGATIVE

## 2024-01-20 NOTE — Addendum Note (Signed)
 Addended by: ORLINDA SILVANO ORN on: 01/20/2024 10:55 AM   Modules accepted: Orders

## 2024-01-20 NOTE — Addendum Note (Signed)
 Addended by: ORLINDA SILVANO ORN on: 01/20/2024 09:17 AM   Modules accepted: Orders

## 2024-01-20 NOTE — Progress Notes (Addendum)
 Pt here for NST. Pt is 36 weeks 1 day pregnant. Pt reported good fetal movement with no current concerns. NST reviewed by RN and Dr. Cleatus, reactive and reassuring for gestational age. Follow up as scheduled.  Silvano LELON Piano, RN

## 2024-01-22 LAB — STREP GP B NAA: Strep Gp B NAA: POSITIVE — AB

## 2024-01-24 ENCOUNTER — Ambulatory Visit: Payer: Self-pay | Admitting: Obstetrics and Gynecology

## 2024-01-24 ENCOUNTER — Encounter (HOSPITAL_COMMUNITY)
Admission: RE | Admit: 2024-01-24 | Discharge: 2024-01-24 | Disposition: A | Source: Ambulatory Visit | Attending: Family Medicine

## 2024-01-24 ENCOUNTER — Other Ambulatory Visit: Payer: Self-pay | Admitting: Family Medicine

## 2024-01-24 DIAGNOSIS — O3663X0 Maternal care for excessive fetal growth, third trimester, not applicable or unspecified: Secondary | ICD-10-CM

## 2024-01-24 DIAGNOSIS — Z98891 History of uterine scar from previous surgery: Secondary | ICD-10-CM

## 2024-01-24 DIAGNOSIS — O24410 Gestational diabetes mellitus in pregnancy, diet controlled: Secondary | ICD-10-CM

## 2024-01-24 DIAGNOSIS — B951 Streptococcus, group B, as the cause of diseases classified elsewhere: Secondary | ICD-10-CM | POA: Insufficient documentation

## 2024-01-24 HISTORY — DX: Gestational diabetes mellitus in pregnancy, unspecified control: O24.419

## 2024-01-24 LAB — CBC
HCT: 43.1 % (ref 36.0–46.0)
Hemoglobin: 14.5 g/dL (ref 12.0–15.0)
MCH: 31.7 pg (ref 26.0–34.0)
MCHC: 33.6 g/dL (ref 30.0–36.0)
MCV: 94.3 fL (ref 80.0–100.0)
Platelets: 212 K/uL (ref 150–400)
RBC: 4.57 MIL/uL (ref 3.87–5.11)
RDW: 14.6 % (ref 11.5–15.5)
WBC: 7.6 K/uL (ref 4.0–10.5)
nRBC: 0 % (ref 0.0–0.2)

## 2024-01-24 LAB — TYPE AND SCREEN
ABO/RH(D): A POS
Antibody Screen: NEGATIVE

## 2024-01-25 ENCOUNTER — Encounter: Admitting: Obstetrics and Gynecology

## 2024-01-25 ENCOUNTER — Ambulatory Visit: Admitting: Obstetrics and Gynecology

## 2024-01-25 VITALS — BP 112/74 | HR 84 | Wt 173.0 lb

## 2024-01-25 DIAGNOSIS — O24410 Gestational diabetes mellitus in pregnancy, diet controlled: Secondary | ICD-10-CM

## 2024-01-25 DIAGNOSIS — O3663X0 Maternal care for excessive fetal growth, third trimester, not applicable or unspecified: Secondary | ICD-10-CM

## 2024-01-25 DIAGNOSIS — Z3480 Encounter for supervision of other normal pregnancy, unspecified trimester: Secondary | ICD-10-CM

## 2024-01-25 LAB — SYPHILIS: RPR W/REFLEX TO RPR TITER AND TREPONEMAL ANTIBODIES, TRADITIONAL SCREENING AND DIAGNOSIS ALGORITHM: RPR Ser Ql: NONREACTIVE

## 2024-01-25 NOTE — Progress Notes (Signed)
 Patient wanting to speak with provider about her scheduled c-section tomorrow. Patient wanting to discuss pros and cons of waiting another week. Delon Barrio RN

## 2024-01-25 NOTE — Progress Notes (Signed)
   PRENATAL VISIT NOTE  Subjective:  Savannah Wade is a 33 y.o. H3E7967 at [redacted]w[redacted]d being seen today for ongoing prenatal care.  She is currently monitored for the following issues for this high-risk pregnancy and has H/O cesarean section; Encounter for supervision of normal pregnancy, antepartum; AMA (advanced maternal age) multigravida 35+; Diastasis recti; HSV infection; Gestational diabetes mellitus, diet-controlled; Excessive fetal growth affecting management of mother in third trimester, antepartum; and Positive GBS test on their problem list.  Patient reports no complaints.  Contractions: Not present. Vag. Bleeding: None.  Movement: Present. Denies leaking of fluid.   The following portions of the patient's history were reviewed and updated as appropriate: allergies, current medications, past family history, past medical history, past social history, past surgical history and problem list.   Objective:   Vitals:   01/25/24 1323  BP: 112/74  Pulse: 84  Weight: 173 lb (78.5 kg)    Fetal Status:  Fetal Heart Rate (bpm): NST   Movement: Present    General: Alert, oriented and cooperative. Patient is in no acute distress.  Skin: Skin is warm and dry. No rash noted.   Cardiovascular: Normal heart rate noted  Respiratory: Normal respiratory effort, no problems with respiration noted  Abdomen: Soft, gravid, appropriate for gestational age.  Pain/Pressure: Absent     Pelvic: Cervical exam deferred        Extremities: Normal range of motion.  Edema: None  Mental Status: Normal mood and affect. Normal behavior. Normal judgment and thought content.      08/18/2023    9:11 AM  Depression screen PHQ 2/9  Decreased Interest 1  Down, Depressed, Hopeless 0  PHQ - 2 Score 1  Altered sleeping 1  Tired, decreased energy 1  Change in appetite 0  Feeling bad or failure about yourself  0  Trouble concentrating 0  Moving slowly or fidgety/restless 0  Suicidal thoughts 0  PHQ-9 Score 3       Data saved with a previous flowsheet row definition        08/18/2023    9:11 AM  GAD 7 : Generalized Anxiety Score  Nervous, Anxious, on Edge 2  Control/stop worrying 2  Worry too much - different things 2  Trouble relaxing 1  Restless 0  Easily annoyed or irritable 0  Afraid - awful might happen 2  Total GAD 7 Score 9    Assessment and Plan:  Pregnancy: H3E7967 at 106w6d  1. Supervision of other normal pregnancy, antepartum (Primary)  NST reactive with + accels, no decels, and moderate variability.   2. Diet controlled gestational diabetes mellitus (GDM) in third trimester  No log today but reports most BS are normal. Fastings <95 and 2 hour PP less than 120 Delivery scheduled tomorrow at 37 weeks due to ARA, DM and LGA.  3. Excessive fetal growth affecting management of pregnancy in third trimester, single or unspecified fetus  EFW 94%tile 01/06/2024  Preterm labor symptoms and general obstetric precautions including but not limited to vaginal bleeding, contractions, leaking of fluid and fetal movement were reviewed in detail with the patient. Please refer to After Visit Summary for other counseling recommendations.   No follow-ups on file.  Future Appointments  Date Time Provider Department Center  02/02/2024 10:00 AM [facility] NURSE [facility]  03/08/2024 10:30 AM Anyanwu, Gloris LABOR, MD [facility]    Delon Emms, NP

## 2024-01-25 NOTE — Anesthesia Preprocedure Evaluation (Addendum)
 Anesthesia Evaluation  Patient identified by MRN, date of birth, ID band Patient awake    Reviewed: Allergy & Precautions, H&P , NPO status , Patient's Chart, lab work & pertinent test results  Airway Mallampati: I  TM Distance: >3 FB Neck ROM: full    Dental no notable dental hx. (+) Teeth Intact   Pulmonary former smoker   Pulmonary exam normal breath sounds clear to auscultation       Cardiovascular Exercise Tolerance: Good negative cardio ROS Normal cardiovascular exam Rhythm:regular Rate:Normal     Neuro/Psych negative neurological ROS  negative psych ROS   GI/Hepatic negative GI ROS, Neg liver ROS,,,  Endo/Other  diabetes, Gestational    Renal/GU negative Renal ROS  negative genitourinary   Musculoskeletal negative musculoskeletal ROS (+)    Abdominal  (+) + obese  Peds  Hematology negative hematology ROS (+)   Anesthesia Other Findings   Reproductive/Obstetrics (+) Pregnancy H/o c-section x2, HSV                              Anesthesia Physical Anesthesia Plan  ASA: 3  Anesthesia Plan: Spinal   Post-op Pain Management: Minimal or no pain anticipated   Induction: Intravenous  PONV Risk Score and Plan: 3 and Ondansetron , Treatment may vary due to age or medical condition and Scopolamine  patch - Pre-op  Airway Management Planned: Simple Face Mask and Natural Airway  Additional Equipment: Fetal Monitoring  Intra-op Plan:   Post-operative Plan:   Informed Consent: I have reviewed the patients History and Physical, chart, labs and discussed the procedure including the risks, benefits and alternatives for the proposed anesthesia with the patient or authorized representative who has indicated his/her understanding and acceptance.       Plan Discussed with: Anesthesiologist and CRNA  Anesthesia Plan Comments:          Anesthesia Quick Evaluation

## 2024-01-26 ENCOUNTER — Inpatient Hospital Stay (HOSPITAL_COMMUNITY): Payer: Self-pay | Admitting: Anesthesiology

## 2024-01-26 ENCOUNTER — Encounter (HOSPITAL_COMMUNITY): Payer: Self-pay | Admitting: Family Medicine

## 2024-01-26 ENCOUNTER — Inpatient Hospital Stay (HOSPITAL_COMMUNITY)
Admission: RE | Admit: 2024-01-26 | Discharge: 2024-01-28 | DRG: 787 | Disposition: A | Attending: Family Medicine | Admitting: Family Medicine

## 2024-01-26 ENCOUNTER — Encounter (HOSPITAL_COMMUNITY): Admission: RE | Disposition: A | Payer: Self-pay | Source: Home / Self Care | Attending: Family Medicine

## 2024-01-26 ENCOUNTER — Other Ambulatory Visit: Payer: Self-pay

## 2024-01-26 DIAGNOSIS — A6000 Herpesviral infection of urogenital system, unspecified: Secondary | ICD-10-CM | POA: Diagnosis present

## 2024-01-26 DIAGNOSIS — B951 Streptococcus, group B, as the cause of diseases classified elsewhere: Secondary | ICD-10-CM | POA: Diagnosis present

## 2024-01-26 DIAGNOSIS — O34219 Maternal care for unspecified type scar from previous cesarean delivery: Secondary | ICD-10-CM | POA: Diagnosis present

## 2024-01-26 DIAGNOSIS — O24420 Gestational diabetes mellitus in childbirth, diet controlled: Secondary | ICD-10-CM | POA: Diagnosis present

## 2024-01-26 DIAGNOSIS — O99824 Streptococcus B carrier state complicating childbirth: Secondary | ICD-10-CM | POA: Diagnosis present

## 2024-01-26 DIAGNOSIS — O9832 Other infections with a predominantly sexual mode of transmission complicating childbirth: Secondary | ICD-10-CM | POA: Diagnosis present

## 2024-01-26 DIAGNOSIS — O321XX0 Maternal care for breech presentation, not applicable or unspecified: Secondary | ICD-10-CM | POA: Diagnosis present

## 2024-01-26 DIAGNOSIS — O409XX0 Polyhydramnios, unspecified trimester, not applicable or unspecified: Secondary | ICD-10-CM | POA: Diagnosis present

## 2024-01-26 DIAGNOSIS — Z3A37 37 weeks gestation of pregnancy: Secondary | ICD-10-CM

## 2024-01-26 DIAGNOSIS — O09523 Supervision of elderly multigravida, third trimester: Secondary | ICD-10-CM | POA: Diagnosis not present

## 2024-01-26 DIAGNOSIS — O3663X0 Maternal care for excessive fetal growth, third trimester, not applicable or unspecified: Secondary | ICD-10-CM | POA: Diagnosis present

## 2024-01-26 DIAGNOSIS — O09529 Supervision of elderly multigravida, unspecified trimester: Secondary | ICD-10-CM

## 2024-01-26 DIAGNOSIS — O403XX0 Polyhydramnios, third trimester, not applicable or unspecified: Secondary | ICD-10-CM | POA: Diagnosis present

## 2024-01-26 DIAGNOSIS — Z8249 Family history of ischemic heart disease and other diseases of the circulatory system: Secondary | ICD-10-CM | POA: Diagnosis not present

## 2024-01-26 DIAGNOSIS — Z3480 Encounter for supervision of other normal pregnancy, unspecified trimester: Principal | ICD-10-CM

## 2024-01-26 DIAGNOSIS — Z98891 History of uterine scar from previous surgery: Secondary | ICD-10-CM

## 2024-01-26 DIAGNOSIS — B009 Herpesviral infection, unspecified: Secondary | ICD-10-CM | POA: Diagnosis present

## 2024-01-26 DIAGNOSIS — O99820 Streptococcus B carrier state complicating pregnancy: Secondary | ICD-10-CM | POA: Diagnosis not present

## 2024-01-26 DIAGNOSIS — O34211 Maternal care for low transverse scar from previous cesarean delivery: Secondary | ICD-10-CM | POA: Diagnosis present

## 2024-01-26 DIAGNOSIS — O24410 Gestational diabetes mellitus in pregnancy, diet controlled: Secondary | ICD-10-CM | POA: Diagnosis present

## 2024-01-26 DIAGNOSIS — Z87891 Personal history of nicotine dependence: Secondary | ICD-10-CM | POA: Diagnosis not present

## 2024-01-26 DIAGNOSIS — Z833 Family history of diabetes mellitus: Secondary | ICD-10-CM | POA: Diagnosis not present

## 2024-01-26 LAB — GLUCOSE, CAPILLARY
Glucose-Capillary: 83 mg/dL (ref 70–99)
Glucose-Capillary: 81 mg/dL (ref 70–99)

## 2024-01-26 SURGERY — Surgical Case
Anesthesia: Spinal

## 2024-01-26 MED ORDER — MORPHINE SULFATE (PF) 0.5 MG/ML IJ SOLN
INTRAMUSCULAR | Status: DC | PRN
  Administered 2024-01-26: 150 ug via INTRATHECAL

## 2024-01-26 MED ORDER — WITCH HAZEL-GLYCERIN EX PADS: 1.0000 | MEDICATED_PAD | CUTANEOUS | Status: DC | PRN

## 2024-01-26 MED ORDER — DIBUCAINE (PERIANAL) 1 % EX OINT: 1.0000 | TOPICAL_OINTMENT | CUTANEOUS | Status: DC | PRN

## 2024-01-26 MED ORDER — KETOROLAC TROMETHAMINE 30 MG/ML IJ SOLN
30.0000 mg | Freq: Once | INTRAMUSCULAR | Status: AC | PRN
  Administered 2024-01-26: 30 mg via INTRAVENOUS

## 2024-01-26 MED ORDER — FENTANYL CITRATE (PF) 100 MCG/2ML IJ SOLN
INTRAMUSCULAR | Status: DC | PRN
  Administered 2024-01-26: 15 ug via INTRATHECAL

## 2024-01-26 MED ORDER — FENTANYL CITRATE (PF) 100 MCG/2ML IJ SOLN
INTRAMUSCULAR | Status: AC
  Filled 2024-01-26: qty 2

## 2024-01-26 MED ORDER — OXYCODONE HCL 5 MG/5ML PO SOLN: 5.0000 mg | Freq: Once | ORAL | Status: DC | PRN

## 2024-01-26 MED ORDER — MEASLES, MUMPS & RUBELLA VAC ~~LOC~~ SUSR
0.5000 mL | Freq: Once | SUBCUTANEOUS | Status: DC
  Filled 2024-01-26: qty 0.5

## 2024-01-26 MED ORDER — KETOROLAC TROMETHAMINE 30 MG/ML IJ SOLN
30.0000 mg | Freq: Four times a day (QID) | INTRAMUSCULAR | Status: AC
  Administered 2024-01-26 – 2024-01-27 (×4): 30 mg via INTRAVENOUS
  Filled 2024-01-26 (×4): qty 1

## 2024-01-26 MED ORDER — PHENYLEPHRINE HCL-NACL 20-0.9 MG/250ML-% IV SOLN
INTRAVENOUS | Status: DC | PRN
  Administered 2024-01-26: 60 ug/min via INTRAVENOUS

## 2024-01-26 MED ORDER — OXYTOCIN-SODIUM CHLORIDE 30-0.9 UT/500ML-% IV SOLN: 2.5000 [IU]/h | INTRAVENOUS | Status: AC

## 2024-01-26 MED ORDER — TRANEXAMIC ACID-NACL 1000-0.7 MG/100ML-% IV SOLN
INTRAVENOUS | Status: DC | PRN
  Administered 2024-01-26: 1000 mg via INTRAVENOUS

## 2024-01-26 MED ORDER — SIMETHICONE 80 MG PO CHEW: 80.0000 mg | CHEWABLE_TABLET | ORAL | Status: DC | PRN

## 2024-01-26 MED ORDER — OXYCODONE HCL 5 MG PO TABS: 5.0000 mg | ORAL_TABLET | ORAL | Status: DC | PRN

## 2024-01-26 MED ORDER — LACTATED RINGERS IV SOLN: INTRAVENOUS | Status: DC

## 2024-01-26 MED ORDER — DIPHENHYDRAMINE HCL 25 MG PO CAPS: 25.0000 mg | ORAL_CAPSULE | Freq: Four times a day (QID) | ORAL | Status: DC | PRN

## 2024-01-26 MED ORDER — MEDROXYPROGESTERONE ACETATE 150 MG/ML IM SUSP: 150.0000 mg | INTRAMUSCULAR | Status: DC | PRN

## 2024-01-26 MED ORDER — NALOXONE HCL 0.4 MG/ML IJ SOLN: 0.4000 mg | INTRAMUSCULAR | Status: DC | PRN

## 2024-01-26 MED ORDER — TETANUS-DIPHTH-ACELL PERTUSSIS 5-2-15.5 LF-MCG/0.5 IM SUSP
0.5000 mL | Freq: Once | INTRAMUSCULAR | Status: DC
  Filled 2024-01-26: qty 0.5

## 2024-01-26 MED ORDER — STERILE WATER FOR IRRIGATION IR SOLN
Status: DC | PRN
  Administered 2024-01-26: 1000 mL

## 2024-01-26 MED ORDER — IBUPROFEN 200 MG PO TABS
400.0000 mg | ORAL_TABLET | Freq: Four times a day (QID) | ORAL | Status: DC
  Administered 2024-01-27 – 2024-01-28 (×4): 400 mg via ORAL
  Filled 2024-01-26 (×4): qty 2

## 2024-01-26 MED ORDER — CEFAZOLIN SODIUM-DEXTROSE 2-4 GM/100ML-% IV SOLN
2.0000 g | INTRAVENOUS | Status: AC
  Administered 2024-01-26: 2 g via INTRAVENOUS

## 2024-01-26 MED ORDER — DIPHENHYDRAMINE HCL 50 MG/ML IJ SOLN: 12.5000 mg | Freq: Four times a day (QID) | INTRAMUSCULAR | Status: DC | PRN

## 2024-01-26 MED ORDER — KETOROLAC TROMETHAMINE 30 MG/ML IJ SOLN
30.0000 mg | Freq: Four times a day (QID) | INTRAMUSCULAR | Status: AC | PRN
  Filled 2024-01-26: qty 1

## 2024-01-26 MED ORDER — COCONUT OIL OIL: 1.0000 | TOPICAL_OIL | Status: DC | PRN

## 2024-01-26 MED ORDER — PHENYLEPHRINE HCL-NACL 20-0.9 MG/250ML-% IV SOLN
INTRAVENOUS | Status: AC
  Filled 2024-01-26: qty 250

## 2024-01-26 MED ORDER — ENOXAPARIN SODIUM 40 MG/0.4ML IJ SOSY
40.0000 mg | PREFILLED_SYRINGE | INTRAMUSCULAR | Status: DC
  Administered 2024-01-27 – 2024-01-28 (×2): 40 mg via SUBCUTANEOUS
  Filled 2024-01-26 (×2): qty 0.4

## 2024-01-26 MED ORDER — ONDANSETRON HCL 4 MG/2ML IJ SOLN: 4.0000 mg | Freq: Once | INTRAMUSCULAR | Status: DC | PRN

## 2024-01-26 MED ORDER — ONDANSETRON HCL 4 MG/2ML IJ SOLN: 4.0000 mg | Freq: Three times a day (TID) | INTRAMUSCULAR | Status: DC | PRN

## 2024-01-26 MED ORDER — PHENYLEPHRINE 80 MCG/ML (10ML) SYRINGE FOR IV PUSH (FOR BLOOD PRESSURE SUPPORT)
PREFILLED_SYRINGE | INTRAVENOUS | Status: DC | PRN
  Administered 2024-01-26: 80 ug via INTRAVENOUS

## 2024-01-26 MED ORDER — POVIDONE-IODINE 10 % EX SWAB
2.0000 | Freq: Once | CUTANEOUS | Status: AC
  Administered 2024-01-26: 2 via TOPICAL

## 2024-01-26 MED ORDER — MENTHOL 3 MG MT LOZG: 1.0000 | LOZENGE | OROMUCOSAL | Status: DC | PRN

## 2024-01-26 MED ORDER — CEFAZOLIN SODIUM-DEXTROSE 2-4 GM/100ML-% IV SOLN
INTRAVENOUS | Status: AC
  Filled 2024-01-26: qty 100

## 2024-01-26 MED ORDER — SENNOSIDES-DOCUSATE SODIUM 8.6-50 MG PO TABS
2.0000 | ORAL_TABLET | Freq: Every day | ORAL | Status: DC
  Administered 2024-01-27 – 2024-01-28 (×2): 2 via ORAL
  Filled 2024-01-26 (×2): qty 2

## 2024-01-26 MED ORDER — ONDANSETRON HCL 4 MG/2ML IJ SOLN
INTRAMUSCULAR | Status: AC
  Filled 2024-01-26: qty 2

## 2024-01-26 MED ORDER — ACETAMINOPHEN 500 MG PO TABS
1000.0000 mg | ORAL_TABLET | Freq: Four times a day (QID) | ORAL | Status: DC
  Administered 2024-01-26 – 2024-01-28 (×8): 1000 mg via ORAL
  Filled 2024-01-26 (×8): qty 2

## 2024-01-26 MED ORDER — KETOROLAC TROMETHAMINE 30 MG/ML IJ SOLN: 30.0000 mg | Freq: Four times a day (QID) | INTRAMUSCULAR | Status: AC | PRN

## 2024-01-26 MED ORDER — FENTANYL CITRATE (PF) 100 MCG/2ML IJ SOLN: 25.0000 ug | INTRAMUSCULAR | Status: DC | PRN

## 2024-01-26 MED ORDER — GABAPENTIN 100 MG PO CAPS
100.0000 mg | ORAL_CAPSULE | Freq: Three times a day (TID) | ORAL | Status: DC
  Administered 2024-01-26 – 2024-01-28 (×6): 100 mg via ORAL
  Filled 2024-01-26 (×6): qty 1

## 2024-01-26 MED ORDER — SODIUM CHLORIDE 0.9% FLUSH: 3.0000 mL | INTRAVENOUS | Status: DC | PRN

## 2024-01-26 MED ORDER — OXYCODONE HCL 5 MG PO TABS: 5.0000 mg | ORAL_TABLET | Freq: Once | ORAL | Status: DC | PRN

## 2024-01-26 MED ORDER — PRENATAL MULTIVITAMIN CH
1.0000 | ORAL_TABLET | Freq: Every day | ORAL | Status: DC
  Administered 2024-01-26 – 2024-01-28 (×3): 1 via ORAL
  Filled 2024-01-26 (×3): qty 1

## 2024-01-26 MED ORDER — BUPIVACAINE IN DEXTROSE 0.75-8.25 % IT SOLN
INTRATHECAL | Status: DC | PRN
  Administered 2024-01-26: 1.55 mL via INTRATHECAL

## 2024-01-26 MED ORDER — SODIUM CHLORIDE 0.9 % IR SOLN
Status: DC | PRN
  Administered 2024-01-26: 1

## 2024-01-26 MED ORDER — KETOROLAC TROMETHAMINE 30 MG/ML IJ SOLN
INTRAMUSCULAR | Status: AC
  Filled 2024-01-26: qty 1

## 2024-01-26 MED ORDER — ARTIFICIAL TEARS OPHTHALMIC OINT
TOPICAL_OINTMENT | OPHTHALMIC | Status: AC
  Filled 2024-01-26: qty 7

## 2024-01-26 MED ORDER — ONDANSETRON HCL 4 MG/2ML IJ SOLN
INTRAMUSCULAR | Status: DC | PRN
  Administered 2024-01-26: 4 mg via INTRAVENOUS

## 2024-01-26 MED ORDER — ACETAMINOPHEN 10 MG/ML IV SOLN
INTRAVENOUS | Status: DC | PRN
  Administered 2024-01-26: 1000 mg via INTRAVENOUS

## 2024-01-26 MED ORDER — ACETAMINOPHEN 500 MG PO TABS: 1000.0000 mg | ORAL_TABLET | Freq: Four times a day (QID) | ORAL | Status: DC

## 2024-01-26 MED ORDER — MEPERIDINE HCL 25 MG/ML IJ SOLN: 6.2500 mg | INTRAMUSCULAR | Status: DC | PRN

## 2024-01-26 MED ORDER — MORPHINE SULFATE (PF) 0.5 MG/ML IJ SOLN
INTRAMUSCULAR | Status: AC
  Filled 2024-01-26: qty 10

## 2024-01-26 MED ORDER — OXYTOCIN-SODIUM CHLORIDE 30-0.9 UT/500ML-% IV SOLN
INTRAVENOUS | Status: DC | PRN
  Administered 2024-01-26: 300 mL via INTRAVENOUS

## 2024-01-26 MED ORDER — PROMETHAZINE (PHENERGAN) 6.25MG IN NS 50ML IVPB: 6.2500 mg | INTRAVENOUS | Status: DC | PRN

## 2024-01-26 MED ORDER — DEXAMETHASONE SOD PHOSPHATE PF 10 MG/ML IJ SOLN
INTRAMUSCULAR | Status: DC | PRN
  Administered 2024-01-26: 5 mg via INTRAVENOUS

## 2024-01-26 MED ORDER — SIMETHICONE 80 MG PO CHEW
80.0000 mg | CHEWABLE_TABLET | Freq: Three times a day (TID) | ORAL | Status: DC
  Administered 2024-01-26 – 2024-01-28 (×5): 80 mg via ORAL
  Filled 2024-01-26 (×7): qty 1

## 2024-01-26 SURGICAL SUPPLY — 30 items
CHLORAPREP W/TINT 26 (MISCELLANEOUS) ×2 IMPLANT
CLAMP UMBILICAL CORD (MISCELLANEOUS) ×1 IMPLANT
CLOTH BEACON ORANGE TIMEOUT ST (SAFETY) ×1 IMPLANT
DERMABOND ADVANCED .7 DNX12 (GAUZE/BANDAGES/DRESSINGS) IMPLANT
DRSG OPSITE POSTOP 4X10 (GAUZE/BANDAGES/DRESSINGS) ×1 IMPLANT
ELECTRODE REM PT RTRN 9FT ADLT (ELECTROSURGICAL) ×1 IMPLANT
EXTRACTOR VACUUM M CUP 4 TUBE (SUCTIONS) IMPLANT
GAUZE SPONGE 4X4 12PLY STRL LF (GAUZE/BANDAGES/DRESSINGS) IMPLANT
GLOVE BIO SURGEON STRL SZ7.5 (GLOVE) ×1 IMPLANT
GLOVE BIOGEL PI IND STRL 7.0 (GLOVE) ×2 IMPLANT
GLOVE BIOGEL PI IND STRL 8 (GLOVE) ×1 IMPLANT
GOWN STRL REUS W/TWL LRG LVL3 (GOWN DISPOSABLE) ×2 IMPLANT
GOWN STRL REUS W/TWL XL LVL3 (GOWN DISPOSABLE) ×1 IMPLANT
KIT ABG SYR 3ML LUER SLIP (SYRINGE) IMPLANT
MAT PREVALON FULL STRYKER (MISCELLANEOUS) IMPLANT
NDL HYPO 25X5/8 SAFETYGLIDE (NEEDLE) IMPLANT
NS IRRIG 1000ML POUR BTL (IV SOLUTION) ×1 IMPLANT
PACK C SECTION WH (CUSTOM PROCEDURE TRAY) ×1 IMPLANT
PAD ABD 8X10 STRL (GAUZE/BANDAGES/DRESSINGS) IMPLANT
PAD OB MATERNITY 4.3X12.25 (PERSONAL CARE ITEMS) ×1 IMPLANT
RTRCTR C-SECT PINK 25CM LRG (MISCELLANEOUS) ×1 IMPLANT
SUT MNCRL 0 VIOLET CTX 36 (SUTURE) ×2 IMPLANT
SUT PLAIN ABS 2-0 CT1 27XMFL (SUTURE) IMPLANT
SUT VIC AB 0 CTX36XBRD ANBCTRL (SUTURE) ×1 IMPLANT
SUT VIC AB 2-0 CT1 TAPERPNT 27 (SUTURE) ×1 IMPLANT
SUT VIC AB 4-0 KS 27 (SUTURE) ×1 IMPLANT
TAPE CLOTH SURG 4X10 WHT LF (GAUZE/BANDAGES/DRESSINGS) IMPLANT
TOWEL OR 17X24 6PK STRL BLUE (TOWEL DISPOSABLE) ×1 IMPLANT
TRAY FOLEY W/BAG SLVR 14FR LF (SET/KITS/TRAYS/PACK) ×1 IMPLANT
WATER STERILE IRR 1000ML POUR (IV SOLUTION) ×1 IMPLANT

## 2024-01-26 NOTE — Transfer of Care (Signed)
 Immediate Anesthesia Transfer of Care Note  Patient: Savannah Wade  Procedure(s) Performed: CESAREAN DELIVERY  Patient Location: PACU  Anesthesia Type:Spinal  Level of Consciousness: awake  Airway & Oxygen Therapy: Patient Spontanous Breathing  Post-op Assessment: Report given to RN and Post -op Vital signs reviewed and stable  Post vital signs: Reviewed and stable  Last Vitals:  Vitals Value Taken Time  BP 101/59 01/26/24 11:42  Temp    Pulse 70 01/26/24 11:45  Resp 14 01/26/24 11:45  SpO2 91 % 01/26/24 11:45  Vitals shown include unfiled device data.  Last Pain:  Vitals:   01/26/24 0649  TempSrc:   PainSc: 0-No pain         Complications: No notable events documented.

## 2024-01-26 NOTE — H&P (Signed)
 Faculty Practice H&P  Savannah Wade is a 33 y.o. female [phone] with IUP at [redacted]w[redacted]d by first tri u/s presenting for repeat cesarean section for 2 prior CS. Pregnancy was been complicated by gDMa1.    Pt states she has been having contractions, no vaginal bleeding, intact membranes, with normal fetal movement.     Prenatal Course Source of Care: [facility] with onset of care at 14weeks  Pregnancy complications or risks: Patient Active Problem List   Diagnosis Date Noted   Encounter for maternal care for scar from repeat cesarean delivery 01/26/2024   Positive GBS test 01/24/2024   Excessive fetal growth affecting management of mother in third trimester, antepartum 01/11/2024   Gestational diabetes mellitus, diet-controlled 11/26/2023   HSV infection 11/25/2023   Diastasis recti 10/27/2023   Encounter for supervision of normal pregnancy, antepartum 08/18/2023   AMA (advanced maternal age) multigravida 35+ 08/18/2023   H/O cesarean section 10/16/2015   She desires condoms for contraception.  She plans to breastfeed  Prenatal labs and studies: ABO, Rh: --/--/A POS (12/08 1116) Antibody: NEG (12/08 1116) Rubella: Immune (05/28 0000) RPR: NON REACTIVE (12/08 1229)  HBsAg: Negative (05/28 0000)  HIV: Non Reactive (10/09 0948)  GBS: Positive/-- (12/04 0918)  2hr Glucola: positive Genetic screening: not done Anatomy US : normal  Past Medical History:  Past Medical History:  Diagnosis Date   Genital herpes affecting pregnancy in second trimester 09/02/2015   Prophylaxis at [redacted] weeks        Gestational diabetes    Vaginal Pap smear, abnormal     Past Surgical History:  Past Surgical History:  Procedure Laterality Date   CESAREAN SECTION N/A 09/09/2015   Procedure: CESAREAN SECTION;  Surgeon: Norleen LULLA Server, MD;  Location: [facility] BIRTHING SUITES;  Service: Obstetrics;  Laterality: N/A;   CESAREAN SECTION N/A 10/28/2017   Procedure: REPEAT CESAREAN SECTION;  Surgeon: Lorence Ozell CROME, MD;   Location: [facility] BIRTHING SUITES;  Service: Obstetrics;  Laterality: N/A;   WISDOM TOOTH EXTRACTION      Obstetrical History:  OB History  Gravida Para Term Preterm AB Living  6 2 2  3 2   SAB IAB Ectopic Multiple Live Births  1   0 2    # Outcome Date GA Lbr Len/2nd Weight Sex Type Anes PTL Lv  6 Current           5 AB 02/07/23     SAB     4 Term 10/28/17 [redacted]w[redacted]d  3430 g F CS-Vac Spinal  LIV  3 AB 2018     SAB     2 Term 09/09/15 [redacted]w[redacted]d  3345 g F CS-LTranv   LIV     Complications: Cephalopelvic Disproportion  1 SAB 2016     SAB       Gynecological History:  OB History     Gravida  6   Para  2   Term  2   Preterm      AB  3   Living  2      SAB  1   IAB      Ectopic      Multiple  0   Live Births  2           Social History:  Social History   Socioeconomic History   Marital status: Married    Spouse name: Not on file   Number of children: Not on file   Years of education: Not on file   Highest  education level: Not on file  Occupational History   Occupation: retailer  Tobacco Use   Smoking status: Former    Types: Cigarettes   Smokeless tobacco: Never  Vaping Use   Vaping status: Never Used  Substance and Sexual Activity   Alcohol use: Not Currently    Comment: occasionally   Drug use: No   Sexual activity: Yes    Partners: Male  Other Topics Concern   Not on file  Social History Narrative   Not on file   Social Drivers of Health   Financial Resource Strain: Low Risk  (10/15/2017)   Overall Financial Resource Strain (CARDIA)    Difficulty of Paying Living Expenses: Not hard at all  Food Insecurity: No Food Insecurity (03/28/2021)   Received from [facility]   Hunger Vital Sign    Within the past 12 months, you worried that your food would run out before you got the money to buy more.: Never true    Within the past 12 months, the food you bought just didn't last and you didn't have money to get more.: Never true  Transportation Needs:  No Transportation Needs (10/15/2017)   PRAPARE - Administrator, Civil Service (Medical): No    Lack of Transportation (Non-Medical): No  Physical Activity: Insufficiently Active (10/15/2017)   Exercise Vital Sign    Days of Exercise per Week: 3 days    Minutes of Exercise per Session: 10 min  Stress: No Stress Concern Present (10/15/2017)   [employer] of Occupational Health - Occupational Stress Questionnaire    Feeling of Stress : Not at all  Social Connections: Not on file    Family History:  Family History  Problem Relation Age of Onset   Cancer Father        testicular   Diabetes Paternal Grandmother    Heart attack Paternal Grandmother        bypass surgery   Hypertension Paternal Grandmother    Cancer - Colon Paternal Grandfather        colon   Cancer Paternal Grandfather        prostate   COPD Paternal Grandfather    Heart attack Maternal Grandfather 50   Hyperlipidemia Maternal Grandfather    Heart attack Maternal Grandmother        x 2   Coronary artery disease Maternal Grandmother     Medications:  Prenatal vitamins,  Current Facility-Administered Medications  Medication Dose Route Frequency Provider Last Rate Last Admin   ceFAZolin  (ANCEF ) IVPB 2g/100 mL premix  2 g Intravenous On Call to OR Stinson, Jacob J, DO       lactated ringers  infusion   Intravenous Continuous Stinson, Jacob J, DO 125 mL/hr at 01/26/24 0751 New Bag at 01/26/24 0751    Allergies:  Allergies  Allergen Reactions   Sulfa Antibiotics Hives    Review of Systems: - negative  Physical Exam: Blood pressure 96/76, pulse 90, temperature 97.8 F (36.6 C), temperature source Oral, resp. rate 19, height 5' 2 (1.575 m), weight 78 kg, SpO2 96%. GENERAL: Well-developed, well-nourished female in no acute distress.  LUNGS: respirations nonlabored HEART: Regular ABDOMEN: nontender, gravid EXTREMITIES: no significant edema Presentation: cephalic by BSUS per dr. Danny  Fhts  130s   Pertinent Labs/Studies:   Lab Results  Component Value Date   WBC 7.6 01/24/2024   HGB 14.5 01/24/2024   HCT 43.1 01/24/2024   MCV 94.3 01/24/2024   PLT 212 01/24/2024    Assessment :  Savannah Wade is a 33 y.o. H3E7967 at [redacted]w[redacted]d being admitted for cesarean section secondary to 2 prior CS. Risks/benefits of TOLAC discussed; patient affirms her desire to continue with surgery. Surgery at this gestational age for GDM with LGA and polyhydramnios.  Plan: The risks of cesarean section discussed with the patient included but were not limited to: bleeding which may require transfusion or reoperation; infection which may require antibiotics; injury to bowel, bladder, ureters or other surrounding organs; injury to the fetus; need for additional procedures including hysterectomy in the event of a life-threatening hemorrhage; placental abnormalities wth subsequent pregnancies, incisional problems, thromboembolic phenomenon and other postoperative/anesthesia complications. The patient concurred with the proposed plan, giving informed written consent for the procedure.   Patient has been NPO and will remain NPO for procedure. Declines tubal sterilization.  Preoperative prophylactic Ancef  ordered on call to the OR.   # A1GDM: fasting appropriate today # Polyhydramnios: mild, mvp 9.24 # LGA: noted, efw 94% # HSV: compliant w/ valtrex , denies rash or prodrome  Danny Geralds, DO 01/26/2024, 7:58 AM   Attestation of Attending Supervision of Provider:  Evaluation and management procedures were performed by this provider under my supervision and collaboration. I have reviewed the provider's note and chart, and I agree with the management and plan.   DEVAUGHN BAN, MD Faculty Practice, [facility]

## 2024-01-26 NOTE — Discharge Summary (Signed)
 Postpartum Discharge Summary  Date of Service updated***     Patient Name: Savannah Wade DOB: [birth year] MRN: [id number]  Date of admission: 01/26/2024 Delivery date:01/26/2024 Delivering provider: KANDIS ASA BEDFORD Date of discharge: 01/26/2024  Admitting diagnosis: Encounter for maternal care for low transverse scar from previous cesarean delivery [O34.211] Encounter for maternal care for scar from repeat cesarean delivery [O34.219] Intrauterine pregnancy: [redacted]w[redacted]d     Secondary diagnosis:  Active Problems:   H/O cesarean section   Polyhydramnios   AMA (advanced maternal age) multigravida 35+   HSV infection   Gestational diabetes mellitus, diet-controlled   Excessive fetal growth affecting management of mother in third trimester, antepartum   Positive GBS test   Encounter for maternal care for scar from repeat cesarean delivery  Additional problems: ***    Discharge diagnosis: Term Pregnancy Delivered and GDM A1                                              Post partum procedures:{Postpartum procedures:23558} Augmentation: N/A Complications: None  Hospital course: Sceduled C/S   33 y.o. yo [id number] at [redacted]w[redacted]d was admitted to the hospital 01/26/2024 for scheduled cesarean section with the following indication:Elective Repeat and Macrosomia.Delivery details are as follows:  Membrane Rupture Time/Date: 10:46 AM,01/26/2024  Delivery Method:C-Section, Low Transverse Operative Delivery:N/A Details of operation can be found in separate operative note.    Patient had a postpartum course complicated by***.  She is ambulating, tolerating a regular diet, passing flatus, and urinating well. Patient is discharged home in stable condition on  01/26/24        Newborn Data: Birth date:01/26/2024 Birth time:10:47 AM Gender:Female Living status:Living Apgars:8 ,9  Weight:2910 g    Magnesium Sulfate received: {Mag received:30440022} BMZ received: No Rhophylac:N/A MMR:N/A T-DaP:  declined prenatally Flu:  declined prenatally RSV Vaccine received: {RSV:31013} Transfusion:{Transfusion received:30440034}  Immunizations received: Immunization History  Administered Date(s) Administered   Influenza,inj,Quad PF,6+ Mos 02/24/2016, 03/24/2017, 10/30/2017   Influenza-Unspecified 10/18/2010   MMR 10/30/2017   Tdap 06/05/2015, 07/28/2017    Physical exam  Vitals:   01/26/24 0643 01/26/24 0648  BP:  96/76  Pulse:  90  Resp:  19  Temp:  97.8 F (36.6 C)  TempSrc:  Oral  SpO2:  96%  Weight: 78 kg   Height: 5' 2 (1.575 m)    General: {Exam; general:21111117} Lochia: {Desc; appropriate/inappropriate:30686::appropriate} Uterine Fundus: {Desc; firm/soft:30687} Incision: {Exam; incision:21111123} DVT Evaluation: {Exam; dvt:2111122} Labs: Lab Results  Component Value Date   WBC 7.6 01/24/2024   HGB 14.5 01/24/2024   HCT 43.1 01/24/2024   MCV 94.3 01/24/2024   PLT 212 01/24/2024      Latest Ref Rng & Units 10/29/2017    5:56 AM  CMP  Creatinine 0.44 - 1.00 mg/dL 9.40    Edinburgh Score:    11/25/2017    2:33 PM  Edinburgh Postnatal Depression Scale Screening Tool  I have been able to laugh and see the funny side of things. 0   I have looked forward with enjoyment to things. 0   I have blamed myself unnecessarily when things went wrong. 0   I have been anxious or worried for no good reason. 1   I have felt scared or panicky for no good reason. 1   Things have been getting on top of me. 0   I have  been so unhappy that I have had difficulty sleeping. 0   I have felt sad or miserable. 0   I have been so unhappy that I have been crying. 0   The thought of harming myself has occurred to me. 0   Edinburgh Postnatal Depression Scale Total 2      Data saved with a previous flowsheet row definition   No data recorded  After visit meds:  Allergies as of 01/26/2024       Reactions   Sulfa Antibiotics Hives     Med Rec must be completed prior to  using this [facility]***        Discharge home in stable condition Infant Feeding: {Baby feeding:23562} Infant Disposition:{CHL IP OB HOME WITH FNUYZM:76418} Discharge instruction: per After Visit Summary and Postpartum booklet. Activity: Advance as tolerated. Pelvic rest for 6 weeks.  Diet: routine diet  Future Appointments: Future Appointments  Date Time Provider Department Center  02/02/2024 10:00 AM [facility] NURSE [facility]  03/08/2024 10:30 AM Anyanwu, Gloris LABOR, MD [facility]   Follow up Visit: Message sent 12/10  Please schedule this patient for a In person postpartum visit in 6 weeks with the following provider: Any provider. Additional Postpartum F/U:2 hour GTT and Incision check 1 week  High risk pregnancy complicated by: GDM Delivery mode:  C-Section, Low Transverse Anticipated Birth Control:  Condoms   01/26/2024 Barabara Maier, DO

## 2024-01-26 NOTE — Anesthesia Procedure Notes (Signed)
 Spinal  Patient location during procedure: OR Start time: 01/26/2024 10:25 AM End time: 01/26/2024 10:28 AM Reason for block: surgical anesthesia Staffing Performed: other anesthesia staff  Anesthesiologist: Mallory Manus, MD Performed by: Mallory Manus, MD Authorized by: Mallory Manus, MD   Preanesthetic Checklist Completed: patient identified, IV checked, site marked, risks and benefits discussed, surgical consent, monitors and equipment checked, pre-op evaluation and timeout performed Spinal Block Patient position: sitting Prep: DuraPrep Patient monitoring: heart rate, cardiac monitor, continuous pulse ox and blood pressure Approach: midline Location: L4-5 Injection technique: single-shot Needle Needle type: Sprotte  Needle gauge: 24 G Needle length: 9 cm Assessment Sensory level: T4 Events: CSF return

## 2024-01-26 NOTE — Op Note (Signed)
 Cesarean Section Operative Note   Patient: Savannah Wade  Date of Procedure: 01/26/2024  Procedure: Repeat Low Transverse Cesarean   Indications: patient declines vag del attempt, elective repeat, previous uterine incision: low transverse, macrosomia, and polyhydramnios  Pre-operative Diagnosis: previous cesarean section, desires repeat.   Post-operative Diagnosis: Same  TOLAC Candidate: No and unstable lie  Surgeon: Surgeons and Role:    * Wouk, Devaughn Sayres, MD - Primary    * Danny Geralds, DO - Assisting  Assistants: An experienced assistant was required given the standard of surgical care given the complexity of the case.  This assistant was needed for exposure, dissection, suctioning, retraction, instrument exchange, assisting with delivery with administration of fundal pressure, and for overall help during the procedure.   Anesthesia: spinal  Anesthesiologist: Mallory Manus, MD   Antibiotics: Cefazolin    Estimated Blood Loss: 357 ml   Total IV Fluids: 2200 ml  Urine Output: 100 cc OF clear urine  Specimens: none   Complications: no complications   Indications: Savannah Wade is a 33 y.o. H3E7967 with an IUP [redacted]w[redacted]d presenting for scheduled cesarean secondary to the indications listed above. Clinical course notable for gDMa1 with macrosomia and polyhydramnios.  The risks of cesarean section discussed with the patient included but were not limited to: bleeding which may require transfusion or reoperation; infection which may require antibiotics; injury to bowel, bladder, ureters or other surrounding organs; injury to the fetus; need for additional procedures including hysterectomy in the event of a life-threatening hemorrhage; placental abnormalities with subsequent pregnancies, incisional problems, thromboembolic phenomenon and other postoperative/anesthesia complications. The patient concurred with the proposed plan, giving informed written consent for the procedure.  Patient has been NPO since last night she will remain NPO for procedure. Anesthesia and OR aware. Preoperative prophylactic antibiotics and SCDs ordered on call to the OR.   Findings: Viable infant in breech presentation, no nuchal cord present. Apgars 8, 9, . Weight 2910 g. Clear amniotic fluid. Normal placenta, three vessel cord. Normal uterus, Normal bilateral fallopian tubes, Normal bilateral ovaries. Minimal adhesive disease.  Procedure Details: A Time Out was held and the above information confirmed. The patient received intravenous antibiotics and had sequential compression devices applied to her lower extremities preoperatively. The patient was taken back to the operative suite where spinal anesthesia was administered. After induction of anesthesia, the patient was draped and prepped in the usual sterile manner and placed in a dorsal supine position with a leftward tilt. A low transverse skin incision was made with scalpel and carried down through the subcutaneous tissue to the fascia. Fascial incision was made and extended transversely. The fascia was separated from the underlying rectus tissue superiorly and inferiorly. The rectus muscles were separated in the midline bluntly and the peritoneum was entered bluntly. An Alexis retractor was placed to aid in visualization of the uterus. The utero-vesical peritoneal reflection was incised transversely and the bladder flap was bluntly freed from the lower uterine segment. A low transverse uterine incision was made. The infant was successfully delivered from breech presentation, the umbilical cord was clamped after 1 minute. Cord ph was not sent, and cord blood was obtained for evaluation. The placenta was removed Intact and appeared normal. The uterine incision was closed with a single layer running unlocked suture of 0-Monocryl. Overall, excellent hemostasis was noted. The abdomen and the pelvis were cleared of all clot and debris and the Thersia was  removed. Hemostasis was confirmed on all surfaces.  The peritoneum was reapproximated using  2-0 vicryl . The fascia was then closed using 0 Vicryl in a running fashion. The subcutaneous layer was reapproximated with 2-0 plain gut suture. The skin was closed with a 4-0 vicryl subcuticular stitch. The patient tolerated the procedure well. Sponge, lap, instrument and needle counts were correct x 2. She was taken to the recovery room in stable condition.  Disposition: PACU - hemodynamically stable.    Signed: Barabara Maier, DO FM-OB Fellow Center for [facility]

## 2024-01-26 NOTE — Anesthesia Postprocedure Evaluation (Signed)
 Anesthesia Post Note  Patient: Savannah Wade  Procedure(s) Performed: CESAREAN DELIVERY     Patient location during evaluation: PACU Anesthesia Type: Spinal Level of consciousness: oriented and awake and alert Pain management: pain level controlled Vital Signs Assessment: post-procedure vital signs reviewed and stable Respiratory status: spontaneous breathing, respiratory function stable and patient connected to nasal cannula oxygen Cardiovascular status: blood pressure returned to baseline and stable Postop Assessment: no headache, no backache and no apparent nausea or vomiting Anesthetic complications: no   No notable events documented.  Last Vitals:  Vitals:   01/26/24 1701 01/26/24 2100  BP: (!) 89/54 (!) 95/54  Pulse: 68 70  Resp: 20 18  Temp: 36.5 C 36.6 C  SpO2: 96% 96%    Last Pain:  Vitals:   01/26/24 2100  TempSrc: Oral  PainSc:                  Sury Wentworth

## 2024-01-26 NOTE — Lactation Note (Signed)
 This note was copied from a baby's chart. Lactation Consultation Note  Patient Name: Boy Daijha Leggio Unijb'd Date: 01/26/2024 Age:35 hours Reason for consult: Initial assessment;Early term 37-38.6wks;Maternal endocrine disorder  P3. Experienced BF mom BF her other 2 children for 1 yr each but it has been 4 yrs. Gave mom shells to evert nipples to obtain deeper latch.  Mom has bruising to Rt. Areola from baby BF.  Baby hasn't been interested in BF this evening d/t spitting up and gagging a lot. Mom was holding baby doing STS when LC came in. Because mom had GDM and baby hadn't fed since 2:30 LC thought would hand express colostrum and spoon feed baby. Dad held baby while LC collected some colostrum. LC attempted to give it to the baby via spoon but baby not interested. Baby still gagging  and spitting up. LC used bulb syringe right before I left to clear our mouth. Demonstrated how to lean baby forward and burp baby if he is choking and call for nurse if he doesn't clear.  Gave mom shells to wear in am to soften breast tissue before latching as well as pre-pumping w/hand pump to get nipple everted before latching.  No s/sx of jitteriness. Encouraged parents to call for RN if notices that. Newborn feeding habits, STS, I&O, reviewed. Encouraged mom to call for [facility] for latch assistance when needed. Mom encouraged to feed baby 8-12 times/24 hours and with feeding cues.  Maternal Data Has patient been taught Hand Expression?: Yes Does the patient have breastfeeding experience prior to this delivery?: Yes How long did the patient breastfeed?: 1 yr each. 6 and 35 yrs old.  Feeding    LATCH Score Latch: Too sleepy or reluctant, no latch achieved, no sucking elicited.     Type of Nipple: Flat  Comfort (Breast/Nipple): Filling, red/small blisters or bruises, mild/mod discomfort (bruising to Rt. areola (hickie))         Lactation Tools Discussed/Used Tools: Shells;Pump;Flanges Flange Size:  18 Breast pump type: Manual Pump Education: Setup, frequency, and cleaning;Milk Storage Reason for Pumping: pre-pump to evert nipple Pumping frequency: PRN  Interventions Interventions: Breast feeding basics reviewed;Skin to skin;Breast massage;Hand express;Pre-pump if needed;Breast compression;Expressed milk;Shells;Hand pump;Education;LC Services brochure  Discharge Pump: DEBP (motiff?/) WIC Program: No  Consult Status Consult Status: Follow-up Date: 01/27/24 Follow-up type: In-patient    Emilianna Barlowe G 01/26/2024, 11:20 PM

## 2024-01-27 ENCOUNTER — Encounter (HOSPITAL_COMMUNITY): Payer: Self-pay | Admitting: Obstetrics and Gynecology

## 2024-01-27 LAB — CBC
HCT: 33.4 % — ABNORMAL LOW (ref 36.0–46.0)
Hemoglobin: 11.4 g/dL — ABNORMAL LOW (ref 12.0–15.0)
MCH: 32.2 pg (ref 26.0–34.0)
MCHC: 34.1 g/dL (ref 30.0–36.0)
MCV: 94.4 fL (ref 80.0–100.0)
Platelets: 157 K/uL (ref 150–400)
RBC: 3.54 MIL/uL — ABNORMAL LOW (ref 3.87–5.11)
RDW: 14.6 % (ref 11.5–15.5)
WBC: 9.5 K/uL (ref 4.0–10.5)
nRBC: 0 % (ref 0.0–0.2)

## 2024-01-27 LAB — BIRTH TISSUE RECOVERY COLLECTION (PLACENTA DONATION)

## 2024-01-27 NOTE — Plan of Care (Signed)

## 2024-01-27 NOTE — Patient Instructions (Signed)

## 2024-01-27 NOTE — Progress Notes (Addendum)
 POSTPARTUM PROGRESS NOTE  Post Partum Day 1  Subjective:  Savannah Wade is a 33 y.o. [phone] s/p RLTCS at [redacted]w[redacted]d.  No acute events overnight.  Pt denies problems with ambulating, voiding or po intake.  She denies nausea or vomiting.  Pain is well controlled.  She has had flatus.  Lochia Minimal.   Objective: Blood pressure (!) 86/54, pulse 60, temperature 97.9 F (36.6 C), temperature source Oral, resp. rate 17, height 5' 2 (1.575 m), weight 78 kg, SpO2 96%, unknown if currently breastfeeding.  Physical Exam:  General: alert, cooperative and no distress Chest: no respiratory distress Heart:regular rate, distal pulses intact Abdomen: soft, nontender,  Uterine Fundus: firm, appropriately tender DVT Evaluation: No calf swelling or tenderness Extremities: no edema Skin: warm, dry; incision dressing is clean/dry/intact  Recent Labs    01/24/24 1229 01/27/24 0454  HGB 14.5 11.4*  HCT 43.1 33.4*    Assessment/Plan: Savannah Wade is a 33 y.o. H3E6966 s/p RLTCS at [redacted]w[redacted]d   PPD#1 - Doing well.  Pain is controlled.  Incision is appropriately tender.  Meeting milestones.  BP on the lower side but pt says systolic in the 90s is typical for her and she is asymptomatic. Contraception: condoms Feeding: breast Dispo: Plan for discharge POD3.  A1GDM: glucoses nl   LOS: 1 day   Paralee JONELLE Carpen, MD Family Medicine PGY-1  01/27/2024 10:09 AM  Attestation of Supervision of Resident: Evaluation and management procedures were performed by the learners: Family Medicine Resident under my supervision. I was immediately available for direct supervision, assistance and direction throughout this encounter.  I also confirm that I have verified the information documented in the residents note, and that I have also personally reperformed the pertinent components of the physical exam and all of the medical decision making activities.  I have also made any necessary editorial changes.  Barabara Maier,  DO FM-OB Fellow Center for [employer]

## 2024-01-28 ENCOUNTER — Other Ambulatory Visit (HOSPITAL_COMMUNITY): Payer: Self-pay

## 2024-01-28 MED ORDER — ACETAMINOPHEN 500 MG PO TABS
1000.0000 mg | ORAL_TABLET | Freq: Four times a day (QID) | ORAL | 0 refills | Status: AC
  Filled 2024-01-28: qty 30, 4d supply, fill #0

## 2024-01-28 MED ORDER — SIMETHICONE 80 MG PO CHEW
80.0000 mg | CHEWABLE_TABLET | Freq: Three times a day (TID) | ORAL | 0 refills | Status: DC
  Filled 2024-01-28: qty 30, 10d supply, fill #0

## 2024-01-28 MED ORDER — OXYCODONE HCL 5 MG PO TABS
5.0000 mg | ORAL_TABLET | ORAL | 0 refills | Status: AC | PRN
  Filled 2024-01-28: qty 10, 2d supply, fill #0

## 2024-01-28 MED ORDER — IBUPROFEN 400 MG PO TABS
400.0000 mg | ORAL_TABLET | Freq: Four times a day (QID) | ORAL | 0 refills | Status: AC
  Filled 2024-01-28: qty 30, 8d supply, fill #0

## 2024-01-28 MED ORDER — SENNOSIDES-DOCUSATE SODIUM 8.6-50 MG PO TABS
2.0000 | ORAL_TABLET | Freq: Every day | ORAL | 0 refills | Status: DC
  Filled 2024-01-28: qty 30, 15d supply, fill #0

## 2024-01-28 NOTE — Progress Notes (Signed)
 Discharge medications picked up from [facility] care pharmacy, delivered to patient and explained details to patient. Tammy, Steva [place]

## 2024-01-28 NOTE — Lactation Note (Addendum)
 This note was copied from a baby's chart. Lactation Consultation Note  Patient Name: Savannah Wade Date: 01/28/2024 Age:35 hours Reason for consult: Follow-up assessment;Early term 37-38.6wks;Maternal endocrine disorder (GDM)  P3, 37 weeks.  7.5% weight loss. 4 voids/4 stools in the last 24 hours. Mother aware to hand express and pump to stabilize weight loss or if baby is sleepy.  Mother has baby latched when [facility] entered the room. Baby sleepy at the breast and came off after 2 minutes. Nipple rounded.  Reviewed engorgement care and monitoring voids/stools.  Maternal Data Has patient been taught Hand Expression?: Yes  Feeding Mother's Current Feeding Choice: Breast Milk  LATCH Score Latch: Grasps breast easily, tongue down, lips flanged, rhythmical sucking.  Audible Swallowing: A few with stimulation  Type of Nipple: Everted at rest and after stimulation  Comfort (Breast/Nipple): Soft / non-tender  Hold (Positioning): No assistance needed to correctly position infant at breast.  LATCH Score: 9  Interventions Interventions: Breast feeding basics reviewed;Education  Discharge Discharge Education: Engorgement and breast care;Warning signs for feeding baby Pump: DEBP  Consult Status Consult Status: Complete Date: 01/28/24   Shannon Levorn Lemme  RN, IBCLC 01/28/2024, 10:37 AM

## 2024-01-28 NOTE — Lactation Note (Signed)
 This note was copied from a baby's chart. Lactation Consultation Note  Patient Name: Boy Disney Ruggiero Unijb'd Date: 01/28/2024 Age:35 hours Reason for consult: Follow-up assessment;Early term 37-38.6wks  P3 mom had concerns baby's weight dropped to several oz. Explained to mom the baby will continue to loose wt. Until her  milk comes in. Discussed hand expression or pumping. Mom will try giving baby back colostrum of opposite breast that baby doesn't BF on. Discuss monitoring output and praising mom for baby having good out put as well as good feedings. Mom has good flow of colostrum. Recommended mom hand express or pump and give baby colostrum back if she is worried over baby losing weight. Mom feeling better about baby's output and feedings. Praised mom for all the good feedings. Maternal Data    Feeding    LATCH Score                    Lactation Tools Discussed/Used    Interventions    Discharge    Consult Status Consult Status: Follow-up Date: 01/28/24 Follow-up type: In-patient    Loren Sawaya G 01/28/2024, 6:24 AM

## 2024-02-02 ENCOUNTER — Encounter: Admitting: Obstetrics and Gynecology

## 2024-02-02 ENCOUNTER — Ambulatory Visit

## 2024-02-02 NOTE — Progress Notes (Signed)
 Subjective:  Savannah Wade is a 33 y.o. female here for BP check and incision check. Pt is 1 week post repeat low transverse C-section on 01/26/24.   Hypertension ROS: no medications prescribed at time of discharge, denied any headaches, blurred vision or RUQ pain.  Pt reports incision healing well.  Objective:  BP 101/68   Pulse 77   Ht 5' 1 (1.549 m)   Wt 156 lb (70.8 kg)   Breastfeeding Yes   BMI 29.48 kg/m   Appearance alert, well appearing, and in no distress. Incision healing well, 2 small blisters noted on edge of right side of abdomen where dressing was previously. Small blister on left side healing well with scab. Pt instructed on incision site care and when to notify provider and or go to MAU.   Assessment:   Blood Pressure today in office well controlled.  Incision healed nicely, no signs of infection, scar looks good. Education given. Pt verbalized understanding.  Plan:  Current treatment plan is effective, no change in therapy. Follow up as scheduled for postpartum appointment.  Silvano LELON Piano, RN

## 2024-02-03 ENCOUNTER — Ambulatory Visit

## 2024-02-07 ENCOUNTER — Encounter: Admitting: Obstetrics and Gynecology

## 2024-02-08 ENCOUNTER — Telehealth: Payer: Self-pay

## 2024-02-08 ENCOUNTER — Ambulatory Visit

## 2024-02-08 VITALS — BP 95/68 | HR 70 | Ht 61.0 in | Wt 154.0 lb

## 2024-02-08 DIAGNOSIS — O900 Disruption of cesarean delivery wound: Secondary | ICD-10-CM

## 2024-02-08 DIAGNOSIS — T8149XA Infection following a procedure, other surgical site, initial encounter: Secondary | ICD-10-CM

## 2024-02-08 MED ORDER — CEFADROXIL 500 MG PO CAPS: 500.0000 mg | ORAL_CAPSULE | Freq: Two times a day (BID) | ORAL | 0 refills | Status: AC

## 2024-02-08 NOTE — Addendum Note (Signed)
 Addended by: MILLY PLANAS A on: 02/08/2024 02:24 PM   Modules accepted: Orders, Level of Service

## 2024-02-08 NOTE — Progress Notes (Addendum)
 Subjective:  Savannah Wade is a 33 y.o. female here for BP check and incision check. Pt is 2 weeks s/p C-section, delivery on 01/26/24  Hypertension ROS: none.   Pt reports incision healing well, one small area on left side of incision edge, oozing  Objective: BP 95/68, Pulse 70, WNL Ht 5' 1 (1.549 m)   Wt 154 lb (69.9 kg)   Breastfeeding Yes   BMI 29.10 kg/m   Appearance alert, well appearing, and in no distress. Incision healing well, small left opening about 1 cm, oozing small bright red. CNM to review with recommendations   Assessment:   Blood Pressure today in office well controlled and stable.  Incision drainage noted. Patient education and instructions given.  Plan:  Orders and follow up as documented in patient record.   Silvano LELON Piano, RN  I have reviewed the chart and agree with the nurse's note and plan of care for this visit.  Evidence of superficial irritation/infection at left margin of incision, no evidence of dehiscence on exam with cotton swab.  Treat topically, but given holiday weekend, Rx for Duricef if worsening in 24-48 hours.  F/U incision check in 1 week.   Olam Boards, CNM 2:22 PM

## 2024-02-08 NOTE — Telephone Encounter (Signed)
 RN received message from front office to call patient regarding incision. RN spoke with patient who reported yesterday was up on her feet all day making cookies and felt left side pulling, took a shower and noticed some oozing about 1cm or so. Pt reported vaginal bleeding stopped for a couple days, then started again brownish.  RN advised for patient to be evaluated and scheduled patient for today at 2pm.  Silvano LELON Piano, RN

## 2024-02-15 ENCOUNTER — Telehealth: Payer: Self-pay

## 2024-02-15 NOTE — Telephone Encounter (Signed)
 RN returned patient call, Pt reported incision is still open a little bit, still having some oozing/getting better, clear, some odor, has tried dial soap to wash. Pt reported seems unchanged from last week, no worse or better, did not start antibiotic due to worry that infant might get thrush. Pt denied any fever. Pt scheduled for nurse visit for 02/16/24 at 1:30 pm.   Silvano LELON Piano, RN

## 2024-02-16 ENCOUNTER — Ambulatory Visit

## 2024-02-16 NOTE — Progress Notes (Signed)
 Subjective:  Savannah Wade is a 33 y.o. female here for BP check and incision check. Pt is 3 weeks s/p C-section on 01/26/24.  Hypertension ROS: No concerns  Pt reports incision opened on left side, slight drainage through middle  Objective:  BP 101/67, Pulse 67 Appearance alert, well appearing, and in no distress. Incision slight clear drainage present in center and on left side, small opening on left side still present.    Assessment:   Blood Pressure today in office well controlled and stable.  Incision drainage. MD to evaluate. Incision education given.  Plan:  Orders and follow up as documented in patient record. Pt informed to start antibiotic.  Silvano LELON Piano, RN

## 2024-02-23 ENCOUNTER — Ambulatory Visit: Payer: Self-pay

## 2024-02-23 DIAGNOSIS — O900 Disruption of cesarean delivery wound: Secondary | ICD-10-CM

## 2024-02-23 NOTE — Progress Notes (Signed)
 Subjective:  SAHIBA Wade is a 34 y.o. female here for BP check and incision check. She is 4 weeks postpartum s/p repeat C-section on 01/26/24. Pt reported today will be last day of oral antibiotic.   Hypertension ROS: no concerns or symptoms.   Pt reports incision oozing, small opening on right side of incision check, left opening now closed, no oozing. Pt reported has been using dial soap and keeping clean and dry.  Objective:  BP 98/66   Pulse 62   Ht 5' 1 (1.549 m)   Wt 154 lb (69.9 kg)   Breastfeeding Yes   BMI 29.10 kg/m   Appearance alert, well appearing, and in no distress. Incision slow healing, slight clear drainage present   Assessment:   Blood Pressure today in office well controlled and stable.  Small right side incision separation, oozing clear/blood. Slight irritation with mild odor.   Plan:  Orders and follow up as documented in patient record. MD to evaluate. Silver Nitrate applied by MD, patient to follow up in 1 week. Incision Site care reviewed.   Silvano LELON Piano, RN

## 2024-03-01 ENCOUNTER — Ambulatory Visit: Payer: Self-pay

## 2024-03-01 VITALS — BP 97/57 | HR 60 | Ht 61.0 in | Wt 155.0 lb

## 2024-03-01 DIAGNOSIS — O900 Disruption of cesarean delivery wound: Secondary | ICD-10-CM | POA: Diagnosis not present

## 2024-03-01 DIAGNOSIS — T148XXA Other injury of unspecified body region, initial encounter: Secondary | ICD-10-CM

## 2024-03-01 MED ORDER — CEPHALEXIN 500 MG PO CAPS: 500.0000 mg | ORAL_CAPSULE | Freq: Four times a day (QID) | ORAL | 0 refills | Status: DC

## 2024-03-01 NOTE — Progress Notes (Addendum)
 Subjective:  Savannah Wade is a 34 y.o. female here for BP check and incision check. Pt is 5 weeks postpartum s/p repeat C-section  Hypertension ROS: no concerns or symptoms  Pt reports incision oozing, redness  Objective: BP 97/57, Pulse 60 Ht 5' 1 (1.549 m)   Wt 155 lb (70.3 kg)   Breastfeeding Yes   BMI 29.29 kg/m   Appearance alert, well appearing, and in no distress. Incision slow healing, slight clear drainage present, redness.   Assessment:   Blood Pressure today in office well controlled and stable.  Incision redness around edges with clear oozing.   Plan:  Orders and follow up as documented in patient record. MD to evaluate.  Silvano LELON Piano, RN   I examined this patient with RN. Pfannenstiel incision with poor wound healing, intermittent opening of wound right along suture line, 2-10 mm each, 5-6 openings along entire width. Wound culture taken, all aspects of incision with healthy tissue that bleeds with scant touch, some erythema along superior and inferior aspects along mid portion, tenderness at right aspect where largest opening occurs. Entire wound probed with no depth greater than 5 mm. All open areas cauterized with silver nitrate. Suspect poor wound healing secondary to gDM and 3rd c-section. Will add keflex  and await wound culture. Reviewed healing by secondary intention, would not recommend packing as it will likely force wound open further. Reviewed all of the above with patient and her mother. Cont to keep dry. Rx sent to pharmacy. Return 1 week.  I have reviewed this chart and agree with the RN/CMA assessment and management.    LOIS Yolanda Moats, MD, [facility] Attending Center for [employer] [facility])

## 2024-03-01 NOTE — Addendum Note (Signed)
 Addended by: NICHOLAUS AWKWARD on: 03/01/2024 03:05 PM   Modules accepted: Orders, Level of Service

## 2024-03-04 ENCOUNTER — Other Ambulatory Visit: Payer: Self-pay

## 2024-03-04 ENCOUNTER — Inpatient Hospital Stay (HOSPITAL_COMMUNITY)
Admission: AD | Admit: 2024-03-04 | Discharge: 2024-03-04 | Disposition: A | Discharge status: Home / Self Care | Payer: Self-pay | Attending: Obstetrics & Gynecology | Admitting: Obstetrics & Gynecology

## 2024-03-04 DIAGNOSIS — Z4889 Encounter for other specified surgical aftercare: Secondary | ICD-10-CM | POA: Diagnosis not present

## 2024-03-04 DIAGNOSIS — Z4801 Encounter for change or removal of surgical wound dressing: Secondary | ICD-10-CM | POA: Insufficient documentation

## 2024-03-04 DIAGNOSIS — Z98891 History of uterine scar from previous surgery: Secondary | ICD-10-CM

## 2024-03-04 LAB — WOUND CULTURE

## 2024-03-04 MED ORDER — OXYCODONE-ACETAMINOPHEN 5-325 MG PO TABS
2.0000 | ORAL_TABLET | Freq: Once | ORAL | Status: AC
  Administered 2024-03-04: 2 via ORAL
  Filled 2024-03-04: qty 2

## 2024-03-04 MED ORDER — IBUPROFEN 600 MG PO TABS
600.0000 mg | ORAL_TABLET | Freq: Once | ORAL | Status: AC
  Administered 2024-03-04: 600 mg via ORAL
  Filled 2024-03-04: qty 1

## 2024-03-04 NOTE — MAU Provider Note (Cosign Needed Addendum)
 " History     CSN: [id number]  Arrival date and time: 03/04/24 1317   Event Date/Time   First Provider Initiated Contact with Patient 03/04/24 1401      Chief Complaint  Patient presents with   Wound Check    Savannah Wade is a 34 y.o. [id number] at 5 weeks postpartum from repeat C/S.  She receives care at [facility] and is scheduled for next appt on Wednesday.  She presents today for test results.  She states a wound culture was performed on Wednesday and her results returned today. She states she was prescribed Cephalexin , but was not started today.  She states she called the nurse line and was instructed to come to MAU.   She states she has been seen in the office for this issue and is worried about lack of healing.  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 09/02/2015   Prophylaxis at [redacted] weeks        Gestational diabetes    Vaginal Pap smear, abnormal     Past Surgical History:  Procedure Laterality Date   CESAREAN SECTION N/A 09/09/2015   Procedure: CESAREAN SECTION;  Surgeon: Norleen LULLA Server, MD;  Location: [facility] BIRTHING SUITES;  Service: Obstetrics;  Laterality: N/A;   CESAREAN SECTION N/A 10/28/2017   Procedure: REPEAT CESAREAN SECTION;  Surgeon: Lorence Ozell CROME, MD;  Location: [facility] BIRTHING SUITES;  Service: Obstetrics;  Laterality: N/A;   CESAREAN SECTION N/A 01/26/2024   Procedure: CESAREAN DELIVERY;  Surgeon: Kandis Devaughn Sayres, MD;  Location: [facility] LD ORS;  Service: Obstetrics;  Laterality: N/A;   WISDOM TOOTH EXTRACTION      Family History  Problem Relation Age of Onset   Cancer Father        testicular   Diabetes Paternal Grandmother    Heart attack  Paternal Grandmother        bypass surgery   Hypertension Paternal Grandmother    Cancer - Colon Paternal Grandfather        colon   Cancer Paternal Grandfather        prostate   COPD Paternal Grandfather    Heart attack Maternal Grandfather 50   Hyperlipidemia Maternal Grandfather    Heart attack Maternal Grandmother        x 2   Coronary artery disease Maternal Grandmother     Social History[1]  Allergies: Allergies[2]  Medications Prior to Admission  Medication Sig Dispense Refill Last Dose/Taking   MAGNESIUM PO Take 1-2 tablets by mouth at bedtime.   03/04/2024 Morning   Prenatal Multivit-Min-Fe-FA (PRENATAL VITAMINS PO) Take 1 tablet by mouth daily.    03/04/2024 Morning   VITAMIN D  PO Take by mouth daily.   03/04/2024 Morning   acetaminophen  (TYLENOL ) 500 MG tablet Take 2 tablets (1,000 mg total) by mouth every 6 (six) hours. (Patient not taking: Reported on 03/01/2024) 30 tablet 0    cephALEXin  (KEFLEX ) 500 MG capsule Take 1  capsule (500 mg total) by mouth 4 (four) times daily. 28 capsule 0    ibuprofen  (ADVIL ) 400 MG tablet Take 1 tablet (400 mg total) by mouth every 6 (six) hours. (Patient not taking: Reported on 03/01/2024) 30 tablet 0    oxyCODONE  (OXY IR/ROXICODONE ) 5 MG immediate release tablet Take 1 tablet (5 mg total) by mouth every 4 (four) hours as needed for severe pain (pain score 7-10). (Patient not taking: Reported on 03/01/2024) 10 tablet 0    senna-docusate (SENOKOT-S) 8.6-50 MG tablet Take 2 tablets by mouth daily. (Patient not taking: Reported on 03/01/2024) 30 tablet 0    simethicone  (MYLICON) 80 MG chewable tablet Chew 1 tablet (80 mg total) by mouth 3 (three) times daily after meals. (Patient not taking: Reported on 03/01/2024) 30 tablet 0     Review of Systems  Constitutional:  Negative for chills and fever.  Gastrointestinal:  Positive for abdominal pain (Lower). Negative for constipation, diarrhea, nausea and vomiting.  Genitourinary:  Negative for  difficulty urinating, dysuria, vaginal bleeding and vaginal discharge.   Physical Exam   Blood pressure 107/66, pulse 80, temperature 98.5 F (36.9 C), temperature source Oral, resp. rate 17, height 5' 1 (1.549 m), weight 71.2 kg, SpO2 98%, currently breastfeeding.  Physical Exam Vitals reviewed.  Constitutional:      Appearance: Normal appearance.  HENT:     Head: Normocephalic and atraumatic.  Eyes:     Conjunctiva/sclera: Conjunctivae normal.  Cardiovascular:     Rate and Rhythm: Normal rate.  Pulmonary:     Effort: Pulmonary effort is normal. No respiratory distress.  Abdominal:     Tenderness: There is abdominal tenderness.      Comments: Incision with multiple open areas. Small amt of bloody fluid noted. No erythema or apparent cellulitis. Marked area as above: Open with ~ 3/4cm depth. Incision probed and extends ~2.5cm to patient left. Fascia intact. No apparent hematoma. Area irrigated with normal saline. Wound packed with 1/2in dry iodofoam packing with ~5 cm strip used. Steri-strips applied to remainder of incision. Tegaderm applied over packed area.    Musculoskeletal:        General: Normal range of motion.     Cervical back: Normal range of motion.  Skin:    General: Skin is warm and dry.  Neurological:     Mental Status: She is alert and oriented to person, place, and time.  Psychiatric:        Mood and Affect: Mood normal.        Behavior: Behavior normal.             MAU Course  Procedures Recent Results (from the past 2160 hours)  Cervicovaginal ancillary only     Status: None   Collection Time: 01/20/24  9:17 AM  Result Value Ref Range   Neisseria Gonorrhea Negative    Chlamydia Negative    Comment Normal Reference Ranger Chlamydia - Negative    Comment      Normal Reference Range Neisseria Gonorrhea - Negative  Strep Gp B NAA     Status: Abnormal   Collection Time: 01/20/24  9:18 AM   Specimen: Vaginal/Rectal; Genital   Genital  Result  Value Ref Range   Strep Gp B NAA Positive (A) Negative    Comment: Centers for Disease Control and Prevention (CDC) and American Congress of Obstetricians and Gynecologists (ACOG) guidelines for prevention of perinatal group B streptococcal (GBS) disease specify co-collection of a vaginal and rectal swab specimen to maximize  sensitivity of GBS detection. Per the CDC and ACOG, swabbing both the lower vagina and rectum substantially increases the yield of detection compared with sampling the vagina alone. Penicillin  G, ampicillin, or cefazolin  are indicated for intrapartum prophylaxis of perinatal GBS colonization. Reflex susceptibility testing should be performed prior to use of clindamycin only on GBS isolates from penicillin -allergic women who are considered a high risk for anaphylaxis. Treatment with vancomycin without additional testing is warranted if resistance to clindamycin is noted.   Type and screen     Status: None   Collection Time: 01/24/24 11:16 AM  Result Value Ref Range   ABO/RH(D) A POS    Antibody Screen NEG    Sample Expiration      01/27/2024,2359 Performed at [facility], [address] [place], [place] [address]   CBC     Status: None   Collection Time: 01/24/24 12:29 PM  Result Value Ref Range   WBC 7.6 4.0 - 10.5 K/uL   RBC 4.57 3.87 - 5.11 MIL/uL   Hemoglobin 14.5 12.0 - 15.0 g/dL   HCT 56.8 63.9 - 53.9 %   MCV 94.3 80.0 - 100.0 fL   MCH 31.7 26.0 - 34.0 pg   MCHC 33.6 30.0 - 36.0 g/dL   RDW 85.3 88.4 - 84.4 %   Platelets 212 150 - 400 K/uL   nRBC 0.0 0.0 - 0.2 %    Comment: Performed at [facility], [address] [place], [place] [address]  RPR     Status: None   Collection Time: 01/24/24 12:29 PM  Result Value Ref Range   RPR Ser Ql NON REACTIVE NON REACTIVE    Comment: Performed at [facility], [address] [place], [place] [address]  Glucose, capillary     Status: None   Collection Time: 01/26/24  7:11 AM  Result  Value Ref Range   Glucose-Capillary 83 70 - 99 mg/dL    Comment: Glucose reference range applies only to samples taken after fasting for at least 8 hours.  Glucose, capillary     Status: None   Collection Time: 01/26/24 11:49 AM  Result Value Ref Range   Glucose-Capillary 81 70 - 99 mg/dL    Comment: Glucose reference range applies only to samples taken after fasting for at least 8 hours.  CBC     Status: Abnormal   Collection Time: 01/27/24  4:54 AM  Result Value Ref Range   WBC 9.5 4.0 - 10.5 K/uL   RBC 3.54 (L) 3.87 - 5.11 MIL/uL   Hemoglobin 11.4 (L) 12.0 - 15.0 g/dL   HCT 66.5 (L) 63.9 - 53.9 %   MCV 94.4 80.0 - 100.0 fL   MCH 32.2 26.0 - 34.0 pg   MCHC 34.1 30.0 - 36.0 g/dL   RDW 85.3 88.4 - 84.4 %   Platelets 157 150 - 400 K/uL   nRBC 0.0 0.0 - 0.2 %    Comment: Performed at [facility], [address] [place], [place] [address]  Collect bld for placenta donatation     Status: None   Collection Time: 01/27/24  9:59 AM  Result Value Ref Range   Placenta donation bld collect Collected by Laboratory     Comment: Performed at [facility], [address] [place], [place] [address]  Wound culture     Status: None   Collection Time: 03/01/24 12:21 PM   Specimen: Wound   Wound  Result  Value Ref Range   Gram Stain Result Final report    Organism ID, Bacteria Comment     Comment: Moderate amount of white blood cells.   Organism ID, Bacteria Comment     Comment: Moderate number of gram positive cocci.   Organism ID, Bacteria Comment     Comment: Few gram negative rods.   Aerobic Bacterial Culture Final report    Organism ID, Bacteria Routine flora     Comment: Scant growth     MDM -Results Review -Wound Care -Pain Medication Assessment and Plan  34 year old 5 Weeks Postpartum S/P Repeat Cesarean Section Wound Care  -Patient informed that she should start taking antibiotic as prescribed. -Reviewed results and informed that no dominant bacteria growth  noted, but antibiotics would cover moderate gram positive cocci.  -Discussed large open area with some mild tunneling. Recommend packing. Reviewed how this would aide in healing, but will be painful to perform.  -Discussed pain medication in the form of ibuprofen  and percocet.  -Patient agreeable  Harlene LITTIE Duncans 03/04/2024, 2:01 PM   Reassessment (2:50 PM) -Patient reports comfort.  -Wound cleaned and irrigated with normal saline.  -Packing placed as above. Steri strips placed in other areas. -Instructed to keep next scheduled appt. -Patient questions ability to take medication prior to appt and informed this should not cause issues with GTT testing. -Precautions reviewed. -Reiterated need to start antibiotics today to promote wound healing and avoid infection. -Patient questions regarding wound healing and RTW addressed.  -Discharged to home in stable condition.  Harlene LITTIE Duncans MSN, CNM Advanced Practice Provider, Center for [facility]   Addendum 6:35 PM -Reviewing of chart and patient instructed to keep Wednesday appt, but will need earlier assessment considering packing.  -Attempted to contact patient via her mobile as well as spouse mobile with no answer. No message left.  -Will send mychart message and contact office for follow up.   Harlene LITTIE Duncans MSN, CNM Advanced Practice Provider, Center for [employer]     [1]  Social History Tobacco Use   Smoking status: Former    Types: Cigarettes   Smokeless tobacco: Never  Vaping Use   Vaping status: Never Used  Substance Use Topics   Alcohol use: Not Currently    Comment: occasionally   Drug use: No  [2]  Allergies Allergen Reactions   Sulfa Antibiotics Hives   "

## 2024-03-04 NOTE — MAU Note (Signed)
 MAU Triage Note  Savannah Wade is a 34 y.o. a PP C/S patient here in MAU reporting: c-section incision opened up approx 4 weeks ago. It hasn't closed completely, so she's been seeing her OB weekly for evaluation of the incision. This past Wednesday, the wound was cultured and she received her results via MyChart this morning. She called the nurse line for her office this morning to see if she should begin taking the ABX they Rx her. They informed her to come here to be evaluated, so she doesn't have to wait until Monday, as the nurse wasn't able to interpret the results.   Pain score: denies Vitals:   03/04/24 1338  BP: 107/66  Pulse: 80  Resp: 17  Temp: 98.5 F (36.9 C)  SpO2: 98%      Lab orders placed from triage: none

## 2024-03-06 ENCOUNTER — Ambulatory Visit: Payer: Self-pay | Admitting: Obstetrics and Gynecology

## 2024-03-06 ENCOUNTER — Encounter: Payer: Self-pay | Admitting: Obstetrics & Gynecology

## 2024-03-06 ENCOUNTER — Ambulatory Visit: Payer: Self-pay | Admitting: Obstetrics & Gynecology

## 2024-03-06 VITALS — BP 124/65 | HR 76 | Ht 61.0 in | Wt 157.0 lb

## 2024-03-06 DIAGNOSIS — O900 Disruption of cesarean delivery wound: Secondary | ICD-10-CM

## 2024-03-06 DIAGNOSIS — T8149XA Infection following a procedure, other surgical site, initial encounter: Secondary | ICD-10-CM

## 2024-03-06 NOTE — Progress Notes (Signed)
" ° °  Subjective:    Patient ID: Savannah Wade, female    DOB: 1990/03/23, 34 y.o.   MRN: [id number]  HPI  Pt presents for wound evaluation.  She is 6 weeks postpartum following a repeat cesarean section.  She is currently on Keflex  for +wound culture (GNR).  Pt went to MAU on 03/04/24 and had wound debrided and packed with 1/4 inch plain packing tape.  Steri strips were applied to the left side of incision.  Pt denies fever or abdominal pain.   Review of Systems  Constitutional: Negative.   Respiratory: Negative.    Cardiovascular: Negative.   Gastrointestinal: Negative.   Genitourinary: Negative.   Skin:        Abdominal incision open on the right       Objective:   Physical Exam Vitals reviewed.  Constitutional:      General: She is not in acute distress.    Appearance: She is well-developed.  HENT:     Head: Normocephalic and atraumatic.  Eyes:     Conjunctiva/sclera: Conjunctivae normal.  Cardiovascular:     Rate and Rhythm: Normal rate.  Pulmonary:     Effort: Pulmonary effort is normal.  Abdominal:   Skin:    General: Skin is warm and dry.  Neurological:     Mental Status: She is alert and oriented to person, place, and time.  Psychiatric:        Mood and Affect: Mood normal.    Vitals:   03/06/24 1620  BP: 124/65  Pulse: 76  Weight: 157 lb (71.2 kg)  Height: 5' 1 (1.549 m)   Assessment & Plan:  34 yo female with cesarean scetion wound disruption.    Continue Keflex --GNR and gram positive in cocci from wound culture Pack daily with plain 1/4 inch.  Instructed mother on how to pack.  Per mother who was in the MAU Saturday--would if more beefy red than Saturday. CT with contrast to r.u deeper infection given the length of time that infection has been occurring.   Left side of incision is healing and not deep enough to pack RTC for post partum with Dr. DELENA.    "

## 2024-03-08 ENCOUNTER — Ambulatory Visit: Payer: Self-pay | Admitting: Obstetrics & Gynecology

## 2024-03-08 ENCOUNTER — Encounter: Payer: Self-pay | Admitting: Obstetrics & Gynecology

## 2024-03-08 VITALS — BP 104/69 | HR 63 | Ht 61.0 in | Wt 153.0 lb

## 2024-03-08 DIAGNOSIS — Z8632 Personal history of gestational diabetes: Secondary | ICD-10-CM

## 2024-03-08 DIAGNOSIS — Z1331 Encounter for screening for depression: Secondary | ICD-10-CM

## 2024-03-08 DIAGNOSIS — T8149XA Infection following a procedure, other surgical site, initial encounter: Secondary | ICD-10-CM

## 2024-03-08 NOTE — Progress Notes (Signed)
 "   Post Partum Visit Note  Savannah Wade is a 34 y.o. [phone] female who presents for a postpartum visit. She is 6 weeks postpartum following a repeat cesarean section.  I have fully reviewed the prenatal and intrapartum course - she had A1GDM. The delivery was at 37 gestational weeks.  Anesthesia: spinal. Postpartum course has been good, issues with slow healing incision and superficial incisional infection.  She is currently on Keflex , packing wound daily. Baby is doing well. Baby is feeding by breast. Bleeding no bleeding. Bowel function is normal. Bladder function is normal. Patient is not sexually active. Planned contraception method is vasectomy. Postpartum depression screening: negative.   The pregnancy intention screening data noted above was reviewed. Potential methods of contraception were discussed. The patient elected to proceed with vasectomy.   Edinburgh Postnatal Depression Scale - 03/08/24 0856       Edinburgh Postnatal Depression Scale:  In the Past 7 Days   I have been able to laugh and see the funny side of things. 0    I have looked forward with enjoyment to things. 0    I have blamed myself unnecessarily when things went wrong. 0    I have been anxious or worried for no good reason. 2    I have felt scared or panicky for no good reason. 0    Things have been getting on top of me. 0    I have been so unhappy that I have had difficulty sleeping. 0    I have felt sad or miserable. 0    I have been so unhappy that I have been crying. 0    The thought of harming myself has occurred to me. 0    Edinburgh Postnatal Depression Scale Total 2          Health Maintenance Due  Topic Date Due   Pneumococcal Vaccine (1 of 2 - PCV) Never done   Hepatitis B Vaccines 19-59 Average Risk (1 of 3 - 19+ 3-dose series) Never done   COVID-19 Vaccine (1 - 2025-26 season) Never done    The following portions of the patient's history were reviewed and updated as appropriate:  allergies, current medications, past family history, past medical history, past social history, past surgical history, and problem list.  Review of Systems Pertinent items noted in HPI and remainder of comprehensive ROS otherwise negative.  Objective:  BP 104/69 (BP Location: Right Arm, Patient Position: Sitting, Cuff Size: Normal)   Pulse 63   Ht 5' 1 (1.549 m)   Wt 153 lb (69.4 kg)   Breastfeeding Yes   BMI 28.91 kg/m    General:  alert and no distress   Breasts:  not indicated  Lungs: Normal breath sounds  Heart:  regular rate and rhythm  Abdomen: soft, non-tender; bowel sounds normal; no masses,  no organomegaly   Wound Left incisional defect packing removed, no purulent drainage noted.  Surrounding tissue noted to be healthy. No erythema.  Defect repacked, and reapproximated with steristrips.  GU exam:  not indicated       Assessment:   1. Wound infection after surgery 2. Postpartum care following cesarean delivery 3. History of gestational diabetes mellitus (GDM) (Primary)   Plan:   Essential components of care per ACOG recommendations:  1.  Mood and well being: Patient with negative depression screening today. Reviewed local resources for support.  - Patient tobacco use? No.   - hx of drug use? No.  2. Infant care and feeding:  -Patient currently breastmilk feeding? Yes. Reviewed importance of draining breast regularly to support lactation.  -Social determinants of health (SDOH) reviewed in EPIC. No concerns.  3. Sexuality, contraception and birth spacing - Patient does not want a pregnancy in the next year.  Desired family size is 3 children.  - Reviewed reproductive life planning. Reviewed contraceptive methods based on pt preferences and effectiveness.  Patient desired Vasectomy, will use condoms as interim contraception.   - Discussed birth spacing of 18 months  4. Sleep and fatigue -Encouraged family/partner/community support of 4 hrs of uninterrupted  sleep to help with mood and fatigue  5. Physical Recovery  - Discussed patients delivery and complications. - Patient had a C-section.  - Patient has urinary incontinence? No. - Patient is safe to resume physical and sexual activity - Wound complication: will continue daily wound packing and continue antibiotics as prescribed for now. CT scan ordered, will follow up results and manage accordingly.  6.  Health Maintenance - HM due items addressed Yes - Last pap smear was 07/14/2023 and was negative with negative HRHPV  Pap smear not done at today's visit.  -Breast Cancer screening indicated? No.   7. Chronic Disease/Pregnancy Condition follow up: Gestational Diabetes - 2 hr GTT done today, will follow up results and manage accordingly. - PCP follow up  Gloris Hugger, MD Center for [facility], [facility]  "

## 2024-03-09 ENCOUNTER — Ambulatory Visit: Payer: Self-pay | Admitting: Obstetrics & Gynecology

## 2024-03-09 ENCOUNTER — Encounter: Payer: Self-pay | Admitting: Obstetrics & Gynecology

## 2024-03-09 LAB — GLUCOSE TOLERANCE, 2 HOURS
Glucose, 2 hour: 103 mg/dL (ref 70–139)
Glucose, GTT - Fasting: 74 mg/dL (ref 70–99)

## 2024-03-10 ENCOUNTER — Other Ambulatory Visit: Payer: Self-pay | Admitting: Obstetrics and Gynecology

## 2024-03-10 MED ORDER — CEPHALEXIN 500 MG PO CAPS: 500.0000 mg | ORAL_CAPSULE | Freq: Four times a day (QID) | ORAL | 0 refills | Status: AC

## 2024-03-10 NOTE — Progress Notes (Signed)
 Rx sent to pharmacy as refill for c-section incision infection.

## 2024-03-14 NOTE — Progress Notes (Unsigned)
" ° °  POSTOPERATIVE VISIT NOTE   Subjective:     Savannah Wade is a 34 y.o. [id number] who presents to the clinic s/p wound infection following c-section which was on 12/10. Infection diagnosed in MAU on 1/17.   Incision: *** Vaginal bleeding: ***   The following portions of the patient's history were reviewed and updated as appropriate: allergies, current medications, past family history, past medical history, past social history, past surgical history, and problem list..   Review of Systems Pertinent items are noted in HPI.    Objective:    There were no vitals taken for this visit. General:  {gen appearance:16600}  Abdomen: {post op abd exam:13497::soft,bowel sounds active,non-tender}  Incision:   {incision:13716::healing well,no drainage,no dehiscence,no erythema,no hernia,no seroma,incision well approximated,no swelling}  Pelvic:   {Exam; pelvic:30843}   Assessment:   {doing well:13525::Doing well postoperatively.} Operative findings again reviewed. Pathology report discussed.   Plan:   1. Wound infection after surgery (Primary) - She is on Keflex  for wound infection  - Wound today is ***   Activity restrictions: none Follow up: ***  Vina Solian, MD Obstetrician & Gynecologist, [facility] for [facility], [facility] "

## 2024-03-15 ENCOUNTER — Encounter: Payer: Self-pay | Admitting: Obstetrics and Gynecology

## 2024-03-15 ENCOUNTER — Ambulatory Visit: Payer: Self-pay | Admitting: Obstetrics and Gynecology

## 2024-03-15 VITALS — BP 98/67 | HR 63 | Ht 61.0 in | Wt 156.0 lb

## 2024-03-15 DIAGNOSIS — T8149XA Infection following a procedure, other surgical site, initial encounter: Secondary | ICD-10-CM

## 2024-03-22 ENCOUNTER — Ambulatory Visit: Admitting: Obstetrics and Gynecology

## 2024-03-23 NOTE — Progress Notes (Unsigned)
" ° °  POSTOPERATIVE VISIT NOTE   Subjective:     Savannah Wade is a 34 y.o. [id number] who presents to the clinic s/p wound infection following c-section which was on 12/10.   Pt here with her mother who has helped care for patient, wound and baby.   Incision: ***   The following portions of the patient's history were reviewed and updated as appropriate: allergies, current medications, past family history, past medical history, past social history, past surgical history, and problem list..   Review of Systems Pertinent items are noted in HPI.    Objective:    There were no vitals taken for this visit. General:  alert, cooperative, and no distress  Abdomen: soft, bowel sounds active, non-tender  Incision:   healing well, no drainage, no erythema, no hernia, no seroma, no swelling  Pelvic:   Exam deferred.     Assessment:   Doing well postoperatively.  Plan:   1. Wound infection after surgery (Primary) ***   Activity restrictions: none Follow up: ***  Vina Solian, MD Obstetrician & Gynecologist, [facility] for [facility], [facility] "

## 2024-03-27 ENCOUNTER — Ambulatory Visit: Admitting: Obstetrics and Gynecology

## 2024-03-27 DIAGNOSIS — T8149XA Infection following a procedure, other surgical site, initial encounter: Secondary | ICD-10-CM

## 2024-03-27 DIAGNOSIS — Z98891 History of uterine scar from previous surgery: Secondary | ICD-10-CM
